# Patient Record
Sex: Female | Born: 1970 | Race: White | Hispanic: No | Smoking: Never smoker
Health system: Southern US, Community
[De-identification: ages and names within clinical notes are randomized; demographics above are authoritative.]

## PROBLEM LIST (undated history)

## (undated) DIAGNOSIS — Z114 Encounter for screening for human immunodeficiency virus [HIV]: Secondary | ICD-10-CM

## (undated) DIAGNOSIS — F909 Attention-deficit hyperactivity disorder, unspecified type: Secondary | ICD-10-CM

## (undated) DIAGNOSIS — T7840XA Allergy, unspecified, initial encounter: Secondary | ICD-10-CM

## (undated) DIAGNOSIS — N920 Excessive and frequent menstruation with regular cycle: Secondary | ICD-10-CM

## (undated) DIAGNOSIS — N390 Urinary tract infection, site not specified: Secondary | ICD-10-CM

## (undated) DIAGNOSIS — Z973 Presence of spectacles and contact lenses: Secondary | ICD-10-CM

## (undated) DIAGNOSIS — M1990 Unspecified osteoarthritis, unspecified site: Secondary | ICD-10-CM

## (undated) DIAGNOSIS — Z8619 Personal history of other infectious and parasitic diseases: Secondary | ICD-10-CM

## (undated) HISTORY — DX: Urinary tract infection, site not specified: N39.0

## (undated) HISTORY — DX: Encounter for screening for human immunodeficiency virus (HIV): Z11.4

## (undated) HISTORY — DX: Allergy, unspecified, initial encounter: T78.40XA

## (undated) HISTORY — DX: Personal history of other infectious and parasitic diseases: Z86.19

---

## 2000-01-11 ENCOUNTER — Encounter: Admission: RE | Admit: 2000-01-11 | Discharge: 2000-01-11 | Payer: Self-pay | Admitting: *Deleted

## 2000-01-11 ENCOUNTER — Encounter: Payer: Self-pay | Admitting: *Deleted

## 2000-08-19 ENCOUNTER — Encounter: Admission: RE | Admit: 2000-08-19 | Discharge: 2000-08-19 | Payer: Self-pay | Admitting: *Deleted

## 2000-08-19 ENCOUNTER — Encounter: Payer: Self-pay | Admitting: *Deleted

## 2001-01-09 ENCOUNTER — Other Ambulatory Visit: Admission: RE | Admit: 2001-01-09 | Discharge: 2001-01-09 | Payer: Self-pay | Admitting: *Deleted

## 2002-02-04 ENCOUNTER — Inpatient Hospital Stay (HOSPITAL_COMMUNITY): Admission: AD | Admit: 2002-02-04 | Discharge: 2002-02-08 | Payer: Self-pay | Admitting: Obstetrics and Gynecology

## 2002-02-05 ENCOUNTER — Encounter (INDEPENDENT_AMBULATORY_CARE_PROVIDER_SITE_OTHER): Payer: Self-pay

## 2002-02-15 ENCOUNTER — Encounter: Admission: RE | Admit: 2002-02-15 | Discharge: 2002-03-17 | Payer: Self-pay | Admitting: *Deleted

## 2002-03-12 ENCOUNTER — Other Ambulatory Visit: Admission: RE | Admit: 2002-03-12 | Discharge: 2002-03-12 | Payer: Self-pay | Admitting: Obstetrics and Gynecology

## 2003-04-11 ENCOUNTER — Other Ambulatory Visit: Admission: RE | Admit: 2003-04-11 | Discharge: 2003-04-11 | Payer: Self-pay | Admitting: Obstetrics and Gynecology

## 2006-02-08 HISTORY — PX: WISDOM TOOTH EXTRACTION: SHX21

## 2006-08-15 ENCOUNTER — Ambulatory Visit: Payer: Self-pay | Admitting: Family Medicine

## 2006-12-08 ENCOUNTER — Ambulatory Visit: Payer: Self-pay | Admitting: Family Medicine

## 2008-04-10 ENCOUNTER — Ambulatory Visit: Payer: Self-pay | Admitting: Family Medicine

## 2008-07-17 ENCOUNTER — Ambulatory Visit: Payer: Self-pay | Admitting: Family Medicine

## 2009-11-03 ENCOUNTER — Ambulatory Visit: Payer: Self-pay | Admitting: Family Medicine

## 2010-06-26 NOTE — Op Note (Signed)
NAME:  Raven Gomez, Raven Gomez                       ACCOUNT NO.:  [id number]   MEDICAL RECORD NO.:  [id number]                   PATIENT TYPE:  INP   LOCATION:  9133                                 FACILITY:  [facility]   PHYSICIAN:  Gerri Spore B. Earlene Plater, M.D.               DATE OF BIRTH:  02-08-1971   DATE OF PROCEDURE:  02/05/2002  DATE OF DISCHARGE:                                 OPERATIVE REPORT   PREOPERATIVE DIAGNOSES:  1. Term intrauterine pregnancy, spontaneous labor.  2. Nonreassuring fetal heart rate tracing.  3. Chorioamnionitis.  4. Meconium-stained amniotic fluid.   POSTOPERATIVE DIAGNOSES:  1. Term intrauterine pregnancy, spontaneous labor.  2. Nonreassuring fetal heart rate tracing.  3. Chorioamnionitis.  4. Meconium-stained amniotic fluid.   PROCEDURE:  Primary low transverse cesarean section.   SURGEON:  Chester Holstein. Earlene Plater, M.D.   ANESTHESIA:  Epidural.   FINDINGS:  Viable female infant, Apgars 8 and 10.  Cord pH 7.34.  Thick  meconium.  Weight 6 pounds 11 ounces.   ESTIMATED BLOOD LOSS:  800.   URINE OUTPUT:  350.   COMPLICATIONS:  None.   INDICATIONS:  The patient presented in spontaneous labor, progressed to 8  cm, and developed repetitive late decelerations and chorioamnionitis.  Was  subsequently delivered by primary cesarean section for these reasons.   DESCRIPTION OF PROCEDURE:  The patient was taken to the operating room with  epidural anesthesia in place.  She was prepped and draped in standard  fashion and a Foley catheter was already in the bladder.   A Pfannenstiel incision was made and carried to the underlying fascia  sharply.  The fascia was divided in the midline and extended laterally with  Mayo scissors.  Kocher clamps used to elevate the superior aspect of the  incision and the underlying rectus muscles were dissected off sharply,  repeated inferiorly in a similar fashion.   The midline of the rectus muscles was identified and the underlying  posterior superior peritoneum elevated and entered sharply with the  Metzenbaum scissors.  Extended inferiorly with a good visualization of  surrounding organs.   The bladder blade was inserted, the vesicouterine peritoneum identified,  elevated, and bladder flap created with sharp and blunt technique.   The uterine incision made in a low transverse fashion with a knife.  Thick  meconium noted at amniotomy.   The infant's head was delivered through the incision and the nose and mouth  suctioned with the DeLee and the remainder of the infant delivered without  difficulty.  The cord was clamped and cut and the infant handed off to the  awaiting pediatricians.  The patient was already on penicillin for group B  strep and was also on gentamicin given for her fever; therefore, no  additional antibiotics were given after cord clamp.   The placenta was removed by manual expression and the uterus exteriorized  and cleared of all clots and  debris.  The uterine incision was inspected,  and there was no extension.  The tubes, ovaries, and uterus were also normal  in appearance.   The uterine incision was closed in a running locked stitch of 0 Monocryl.  A  second imbricating layer placed with the same suture and hemostasis  obtained.   The uterus was reinserted in the abdomen and the pelvis irrigated.  The  bladder flap and subfascial spaces were hemostatic.  The fascia was closed  in a running stitch of 0 Vicryl.  The subcutaneous tissue was irrigated and  made hemostatic with the Bovie.  The skin was closed with staples.   The patient tolerated the procedure well, and there were no complications.  She was taken to the recovery room, where she was in stable condition.                                               Gerri Spore B. Earlene Plater, M.D.    WBD/MEDQ  D:  02/05/2002  T:  02/05/2002  Job:  [id number]

## 2010-06-26 NOTE — Discharge Summary (Signed)
   NAME:  Raven Gomez, Raven Gomez                       ACCOUNT NO.:  [id number]   MEDICAL RECORD NO.:  [id number]                   PATIENT TYPE:  INP   LOCATION:  9133                                 FACILITY:  [facility]   PHYSICIAN:  Gerri Spore B. Earlene Plater, M.D.               DATE OF BIRTH:  [birth year]   DATE OF ADMISSION:  02/04/2002  DATE OF DISCHARGE:  02/08/2002                                 DISCHARGE SUMMARY   ADMISSION DIAGNOSES:  1. Term intrauterine pregnancy.  2. Spontaneous labor.  3. Nonreassuring fetal heart rate tracing.  4. Chorioamnionitis.  5. Meconium stained amniotic fluid.   DISCHARGE DIAGNOSES:  1. Term intrauterine pregnancy.  2. Spontaneous labor.  3. Nonreassuring fetal heart rate tracing.  4. Chorioamnionitis.  5. Meconium stained amniotic fluid.   PROCEDURE:  1. Admission for management of labor.  2. Primary low transverse cesarean section for nonreassuring fetal heart     rate tracing.   HISTORY OF PRESENT ILLNESS:  The patient presented in spontaneous labor and  progressed to 8 cm.  Subsequently developed repetitive late decelerations  and chorioamnionitis.  Was therefore delivered by primary cesarean section.  Findings at the time of surgery included a viable female infant, Apgars 8 and  10, cord pH 7.34 with thick meconium, weight 6 pounds 11 ounces.   Postoperatively patient rapidly regained her ability to ambulate, void, and  tolerate a regular diet.  She was kept on intravenous antibiotics for 24  hours and remained afebrile.  Was known to have a superficial wound  cellulitis and was subsequently started on Keflex.  She was discharged home  on the third postoperative day in satisfactory condition.   FOLLOW UP:  In four days for incision check.   DISCHARGE INSTRUCTIONS:  Standard preprinted instructions were also given  prior to dismissal.   DISCHARGE MEDICATIONS:  1. Keflex 500 mg p.o. q.i.d. for 10 days.  2.     Tylox one to two q.4-6h. as needed for  pain.  3. Chromagen Forte one tablet b.i.d. for mild anemia.   DISPOSITION:  Satisfactory.                                               Gerri Spore B. Earlene Plater, M.D.    WBD/MEDQ  D:  03/15/2002  T:  03/15/2002  Job:  [id number]

## 2010-10-09 ENCOUNTER — Encounter: Payer: Self-pay | Admitting: Family Medicine

## 2011-04-02 ENCOUNTER — Other Ambulatory Visit: Payer: Self-pay | Admitting: Dermatology

## 2015-02-11 ENCOUNTER — Ambulatory Visit (INDEPENDENT_AMBULATORY_CARE_PROVIDER_SITE_OTHER): Payer: BLUE CROSS/BLUE SHIELD | Admitting: Family Medicine

## 2015-02-11 VITALS — BP 110/70 | HR 60 | Wt 131.2 lb

## 2015-02-11 DIAGNOSIS — D72819 Decreased white blood cell count, unspecified: Secondary | ICD-10-CM

## 2015-02-11 DIAGNOSIS — R5383 Other fatigue: Secondary | ICD-10-CM | POA: Diagnosis not present

## 2015-02-11 DIAGNOSIS — Z8619 Personal history of other infectious and parasitic diseases: Secondary | ICD-10-CM | POA: Diagnosis not present

## 2015-02-11 LAB — COMPREHENSIVE METABOLIC PANEL
ALK PHOS: 48 U/L (ref 33–115)
ALT: 10 U/L (ref 6–29)
AST: 14 U/L (ref 10–35)
Albumin: 4.2 g/dL (ref 3.6–5.1)
BUN: 10 mg/dL (ref 7–25)
CALCIUM: 8.8 mg/dL (ref 8.6–10.2)
CO2: 26 mmol/L (ref 20–31)
Chloride: 105 mmol/L (ref 98–110)
Creat: 0.81 mg/dL (ref 0.50–1.10)
Glucose, Bld: 88 mg/dL (ref 65–99)
POTASSIUM: 5 mmol/L (ref 3.5–5.3)
Sodium: 140 mmol/L (ref 135–146)
TOTAL PROTEIN: 6.6 g/dL (ref 6.1–8.1)
Total Bilirubin: 0.5 mg/dL (ref 0.2–1.2)

## 2015-02-11 LAB — CBC WITH DIFFERENTIAL/PLATELET
BASOS ABS: 0 10*3/uL (ref 0.0–0.1)
Basophils Relative: 0 % (ref 0–1)
EOS PCT: 1 % (ref 0–5)
Eosinophils Absolute: 0 10*3/uL (ref 0.0–0.7)
HEMATOCRIT: 39.6 % (ref 36.0–46.0)
Hemoglobin: 14.1 g/dL (ref 12.0–15.0)
LYMPHS ABS: 1.4 10*3/uL (ref 0.7–4.0)
Lymphocytes Relative: 36 % (ref 12–46)
MCH: 33.7 pg (ref 26.0–34.0)
MCHC: 35.6 g/dL (ref 30.0–36.0)
MCV: 94.7 fL (ref 78.0–100.0)
MPV: 9.4 fL (ref 8.6–12.4)
Monocytes Absolute: 0.5 10*3/uL (ref 0.1–1.0)
Monocytes Relative: 14 % — ABNORMAL HIGH (ref 3–12)
NEUTROS PCT: 49 % (ref 43–77)
Neutro Abs: 1.9 10*3/uL (ref 1.7–7.7)
Platelets: 241 10*3/uL (ref 150–400)
RBC: 4.18 MIL/uL (ref 3.87–5.11)
RDW: 13.1 % (ref 11.5–15.5)
WBC: 3.8 10*3/uL — ABNORMAL LOW (ref 4.0–10.5)

## 2015-02-11 LAB — POCT MONO (EPSTEIN BARR VIRUS): Mono, POC: NEGATIVE

## 2015-02-11 LAB — TSH: TSH: 1.062 u[IU]/mL (ref 0.350–4.500)

## 2015-02-11 NOTE — Patient Instructions (Signed)
Fatigue  Fatigue is feeling tired all of the time, a lack of energy, or a lack of motivation. Occasional or mild fatigue is often a normal response to activity or life in general. However, long-lasting (chronic) or extreme fatigue may indicate an underlying medical condition.  HOME CARE INSTRUCTIONS   Watch your fatigue for any changes. The following actions may help to lessen any discomfort you are feeling:  · Talk to your health care provider about how much sleep you need each night. Try to get the required amount every night.  · Take medicines only as directed by your health care provider.  · Eat a healthy and nutritious diet. Ask your health care provider if you need help changing your diet.  · Drink enough fluid to keep your urine clear or pale yellow.  · Practice ways of relaxing, such as yoga, meditation, massage therapy, or acupuncture.  · Exercise regularly.    · Change situations that cause you stress. Try to keep your work and personal routine reasonable.  · Do not abuse illegal drugs.  · Limit alcohol intake to no more than 1 drink per day for nonpregnant women and 2 drinks per day for men. One drink equals 12 ounces of beer, 5 ounces of wine, or 1½ ounces of hard liquor.  · Take a multivitamin, if directed by your health care provider.  SEEK MEDICAL CARE IF:   · Your fatigue does not get better.  · You have a fever.    · You have unintentional weight loss or gain.  · You have headaches.    · You have difficulty:      Falling asleep.    Sleeping throughout the night.  · You feel angry, guilty, anxious, or sad.     · You are unable to have a bowel movement (constipation).    · You skin is dry.     · Your legs or another part of your body is swollen.    SEEK IMMEDIATE MEDICAL CARE IF:   · You feel confused.    · Your vision is blurry.  · You feel faint or pass out.    · You have a severe headache.    · You have severe abdominal, pelvic, or back pain.    · You have chest pain, shortness of breath, or an  irregular or fast heartbeat.    · You are unable to urinate or you urinate less than normal.    · You develop abnormal bleeding, such as bleeding from the rectum, vagina, nose, lungs, or nipples.  · You vomit blood.     · You have thoughts about harming yourself or committing suicide.    · You are worried that you might harm someone else.       This information is not intended to replace advice given to you by your health care provider. Make sure you discuss any questions you have with your health care provider.     Document Released: 11/22/2006 Document Revised: 02/15/2014 Document Reviewed: 05/29/2013  Elsevier Interactive Patient Education ©2016 Elsevier Inc.

## 2015-02-11 NOTE — Progress Notes (Signed)
   Subjective:    Patient ID: Raven Gomez, female    DOB: 04-27-1970, 45 y.o.   MRN: [id number]  HPI Chief Complaint  Patient presents with  . new pt    new pt diagnosed with shingles on dec 12th under armpit. and can go for like 3 hours then exhausted. would like a b12 shot. and has a cold on top of that.    She is new to the practice and here for an acute complaint. She complains of feeling tired and wanting to sleep "all the time" for past 2-3 weeks. She states a friend of hers told her she should just get a B12 shot.  She was seen and treated for shingles on December 12th. States rash is mostly gone as well as pain. She also reports having gotten over a head cold over the past week.  Reports history of low white blood cell count.  Denies other significant medical history. She does admit to ADD and depression and is currently taking medication for this.  Sees Dr. Benjie Karvonen at [facility] and has labs done annually at that practice.  Denies fever, chills, nausea, vomiting, diarrhea, unexplained weight loss. Also denies cough, night sweats, chest pain, palpitations, DOE, arthralgias, LE swelling or GU symptoms.   States labs last year were normal.   She is seeing Durene Fruits at [facility] for ADD and cymbalta.  She denies feeling depressed.  Diet is unchanged, states she does not eat breakfast. She does not exercise.  Works as Emergency planning/management officer.    Discussed medications, allergies, past medical and social history.   Review of Systems Pertinent positives and negatives in the history of present illness.    Objective:   Physical Exam BP 110/70 mmHg  Pulse 60  Wt 131 lb 3.2 oz (59.512 kg) Alert and in no distress. No sinus tenderness. Tympanic membranes and canals are normal. Pharyngeal area is normal. Neck is supple without adenopathy or thyromegaly. Cardiac exam shows a regular sinus rhythm without murmurs or gallops. Lungs are clear to auscultation. Abdomen soft, non distended, normal  bowel sounds, mild tenderness to LUQ, without guarding, rebound, or referred pain, no hepatosplenomegaly. No CVAT. Extremity exam shows normal pulses, no edema.   Mono spot negative    Assessment & Plan:  Other fatigue - Plan: POCT Mono (Epstein Barr Virus), TSH, CBC with Differential/Platelet, Comprehensive metabolic panel  History of shingles  Decreased white blood cell count - Plan: CBC with Differential/Platelet  Discussed that based on history and exam there is no clear cut explanation for her symptoms. Her mono spot was negative. Also discussed the numerous differentials for fatigue and that we will get labs to look for an underlying condition that could be causing this. Discussed that she appears to have a viral syndrome that is making her tired but that there could be other etiologies involved. Recommend not skipping meals and eating breakfast and staying well hydrated. Also suggest trying to sleep less during the day and be more physically active.

## 2015-02-12 LAB — VITAMIN D 25 HYDROXY (VIT D DEFICIENCY, FRACTURES): VIT D 25 HYDROXY: 36 ng/mL (ref 30–100)

## 2015-02-27 ENCOUNTER — Encounter: Payer: Self-pay | Admitting: Family Medicine

## 2015-06-27 ENCOUNTER — Ambulatory Visit: Payer: BLUE CROSS/BLUE SHIELD | Admitting: Family Medicine

## 2015-08-27 ENCOUNTER — Telehealth: Payer: Self-pay | Admitting: Family Medicine

## 2015-08-27 NOTE — Telephone Encounter (Signed)
Pt called and requested a copy of her labs printed off and she is going to come pick them up

## 2016-02-04 ENCOUNTER — Encounter: Payer: Self-pay | Admitting: Family Medicine

## 2016-02-04 ENCOUNTER — Ambulatory Visit (INDEPENDENT_AMBULATORY_CARE_PROVIDER_SITE_OTHER): Payer: BLUE CROSS/BLUE SHIELD | Admitting: Family Medicine

## 2016-02-04 VITALS — BP 110/64 | HR 82 | Temp 98.1°F | Resp 16 | Wt 132.6 lb

## 2016-02-04 DIAGNOSIS — D72819 Decreased white blood cell count, unspecified: Secondary | ICD-10-CM | POA: Diagnosis not present

## 2016-02-04 DIAGNOSIS — K120 Recurrent oral aphthae: Secondary | ICD-10-CM

## 2016-02-04 DIAGNOSIS — R5383 Other fatigue: Secondary | ICD-10-CM

## 2016-02-04 LAB — CBC WITH DIFFERENTIAL/PLATELET
BASOS ABS: 0 {cells}/uL (ref 0–200)
Basophils Relative: 0 %
EOS ABS: 47 {cells}/uL (ref 15–500)
EOS PCT: 1 %
HCT: 39.3 % (ref 35.0–45.0)
Hemoglobin: 13.1 g/dL (ref 11.7–15.5)
LYMPHS PCT: 34 %
Lymphs Abs: 1598 cells/uL (ref 850–3900)
MCH: 33.2 pg — AB (ref 27.0–33.0)
MCHC: 33.3 g/dL (ref 32.0–36.0)
MCV: 99.5 fL (ref 80.0–100.0)
MONOS PCT: 10 %
MPV: 9.9 fL (ref 7.5–12.5)
Monocytes Absolute: 470 cells/uL (ref 200–950)
NEUTROS PCT: 55 %
Neutro Abs: 2585 cells/uL (ref 1500–7800)
PLATELETS: 221 10*3/uL (ref 140–400)
RBC: 3.95 MIL/uL (ref 3.80–5.10)
RDW: 12.7 % (ref 11.0–15.0)
WBC: 4.7 10*3/uL (ref 4.0–10.5)

## 2016-02-04 LAB — COMPREHENSIVE METABOLIC PANEL
ALT: 10 U/L (ref 6–29)
AST: 15 U/L (ref 10–35)
Albumin: 4.1 g/dL (ref 3.6–5.1)
Alkaline Phosphatase: 52 U/L (ref 33–115)
BUN: 10 mg/dL (ref 7–25)
CHLORIDE: 104 mmol/L (ref 98–110)
CO2: 24 mmol/L (ref 20–31)
Calcium: 8.9 mg/dL (ref 8.6–10.2)
Creat: 0.67 mg/dL (ref 0.50–1.10)
Glucose, Bld: 92 mg/dL (ref 65–99)
POTASSIUM: 4.2 mmol/L (ref 3.5–5.3)
SODIUM: 137 mmol/L (ref 135–146)
TOTAL PROTEIN: 6.6 g/dL (ref 6.1–8.1)
Total Bilirubin: 0.6 mg/dL (ref 0.2–1.2)

## 2016-02-04 LAB — VITAMIN B12: VITAMIN B 12: 306 pg/mL (ref 200–1100)

## 2016-02-04 LAB — FOLATE: FOLATE: 14.1 ng/mL (ref >5.4)

## 2016-02-04 LAB — TSH: TSH: 1.09 m[IU]/L

## 2016-02-04 MED ORDER — TRIAMCINOLONE ACETONIDE 0.1 % MT PSTE: 1.0000 "application " | PASTE | Freq: Two times a day (BID) | OROMUCOSAL | 12 refills | Status: DC

## 2016-02-04 NOTE — Patient Instructions (Signed)
Canker Sores Introduction Canker sores are small, painful sores that develop inside your mouth. They may also be called aphthous ulcers. You can get canker sores on the inside of your lips or cheeks, on your tongue, or anywhere inside your mouth. You can have just one canker sore or several of them. Canker sores cannot be passed from one person to another (noncontagious). These sores are different than the sores that you may get on the outside of your lips (cold sores or fever blisters). Canker sores usually start as painful red bumps. Then they turn into small white, yellow, or gray ulcers that have red borders. The ulcers may be quite painful. The pain may be worse when you eat or drink. What are the causes? The cause of this condition is not known. What increases the risk? This condition is more likely to develop in:  Women.  People in their teens or 43s.  Women who are having their menstrual period.  People who are under a lot of emotional stress.  People who do not get enough iron or B vitamins.  People who have poor oral hygiene.  People who have an injury inside the mouth. This can happen after having dental work or from chewing something hard. What are the signs or symptoms? Along with the canker sore, symptoms may also include:  Fever.  Fatigue.  Swollen lymph nodes in your neck. How is this diagnosed? This condition can be diagnosed based on your symptoms. Your health care provider will also examine your mouth. Your health care provider may also do tests if you get canker sores often or if they are very bad. Tests may include:  Blood tests to rule out other causes of canker sores.  Taking swabs from the sore to check for infection.  Taking a small piece of skin from the sore (biopsy) to test it for cancer. How is this treated? Most canker sores clear up without treatment in about 10 days. Home care is usually the only treatment that you will need. Over-the-counter  medicines can relieve discomfort.If you have severe canker sores, your health care provider may prescribe:  Numbing ointment to relieve pain.  Vitamins.  Steroid medicines. These may be given as:  Oral pills.  Mouth rinses.  Gels.  Antibiotic mouth rinse. Follow these instructions at home:  Apply, take, or use medicines only as directed by your health care provider. These include vitamins.  If you were prescribed an antibiotic mouth rinse, finish all of it even if you start to feel better.  Until the sores are healed:  Do not drink coffee or citrus juices.  Do not eat spicy or salty foods.  Use a mild, over-the-counter mouth rinse as directed by your health care provider.  Practice good oral hygiene.  Floss your teeth every day.  Brush your teeth with a soft brush twice each day. Contact a health care provider if:  Your symptoms do not get better after two weeks.  You also have a fever or swollen glands.  You get canker sores often.  You have a canker sore that is getting larger.  You cannot eat or drink due to your canker sores. This information is not intended to replace advice given to you by your health care provider. Make sure you discuss any questions you have with your health care provider. Document Released: 05/22/2010 Document Revised: 07/03/2015 Document Reviewed: 12/26/2013  2017 Elsevier

## 2016-02-04 NOTE — Progress Notes (Signed)
   Subjective:    Patient ID: Raven Gomez, female    DOB: 08-04-1970, 45 y.o.   MRN: [id number]  HPI Chief Complaint  Patient presents with  . mouth uclers    4 mouth uclers- feeling - achy, stomach is broken out, occasionally a stingy feeling where the shingles was last year   She is here with complaints of ulcers in her mouth for the past 2 weeks. States she feels bad overall, tired and achy. Has been using OTC medication kanka and occasional salt water for sores. States she has not noticed any improvement in 2 weeks.  States she has been caring for her son and is under a great deal of stress.  Denies history of autoimmune disease or diabetes.  Reports history of oral ulcers but states they have healed in a day or two in the past.  Denies ocular involvement, skin lesions, genital sores, joint pains.   Denies fever, chills, headache, dizziness, rhinorrhea, nasal congestion, post nasal drainage, sore throat, cough, chest pain, palpitations, abdominal pain, N/V/D.   States she had blood work done in the fall at her OB/GYN. She does not know which labs other than she had her white blood count checked due to it being low. States it was still low in October.    No new medications. No new foods.  Does not smoke. Drinks wine 3-5 glasses per week.   She sees her dentist twice annually and last dental exam was 5 months ago.   Reviewed allergies, medications, past medical, surgical,  and social history.   Review of Systems Pertinent positives and negatives in the history of present illness.     Objective:   Physical Exam BP 110/64   Pulse 82   Temp 98.1 F (36.7 C) (Oral)   Resp 16   Wt 132 lb 9.6 oz (60.1 kg)   SpO2 99%   Alert and in no distress. Skin warm and dry. Tympanic membranes and canals are normal. Pharyngeal area is normal. Neck is supple without adenopathy or thyromegaly. Cardiac exam shows a regular sinus rhythm without murmurs or gallops. Lungs are clear to  auscultation.  5 mm oval ulcer with erythematous edges to lower buccal mucosa. 3 mm round-oval ulcer to upper anterior buccal mucosa. Two to three smaller aphthous ulcers throughout buccal mucosa. No soft palate or tongue lesions.       Assessment & Plan:  Recurrent aphthous ulcer - Plan: CBC with Differential/Platelet, Comprehensive metabolic panel, TSH, Vitamin B12, HIV antibody, RPR, Folate, Sedimentation rate  Leukopenia, unspecified type - Plan: CBC with Differential/Platelet  Fatigue, unspecified type - Plan: CBC with Differential/Platelet, Comprehensive metabolic panel, TSH, VITAMIN D 25 Hydroxy (Vit-D Deficiency, Fractures), Vitamin B12, HIV antibody, RPR, Folate, Sedimentation rate  Discussed that there is no obvious explanation for the oral ulcers. She appears to be immunocompetent. Plan to check labs to look for underlying etiology for fatigue and ulcers. Kenalog in orabase prescribed.  History of leukopenia and will recheck this.  Follow up pending labs and in 2 weeks to ensure ulcers are healing.

## 2016-02-05 LAB — HIV ANTIBODY (ROUTINE TESTING W REFLEX): HIV 1&2 Ab, 4th Generation: NONREACTIVE

## 2016-02-05 LAB — SEDIMENTATION RATE: SED RATE: 4 mm/h (ref 0–20)

## 2016-02-05 LAB — VITAMIN D 25 HYDROXY (VIT D DEFICIENCY, FRACTURES): VIT D 25 HYDROXY: 38 ng/mL (ref 30–100)

## 2016-02-05 LAB — RPR

## 2016-06-10 DIAGNOSIS — L255 Unspecified contact dermatitis due to plants, except food: Secondary | ICD-10-CM | POA: Diagnosis not present

## 2016-06-14 DIAGNOSIS — Z01419 Encounter for gynecological examination (general) (routine) without abnormal findings: Secondary | ICD-10-CM | POA: Diagnosis not present

## 2016-06-14 DIAGNOSIS — Z6826 Body mass index (BMI) 26.0-26.9, adult: Secondary | ICD-10-CM | POA: Diagnosis not present

## 2016-06-23 DIAGNOSIS — Z79899 Other long term (current) drug therapy: Secondary | ICD-10-CM | POA: Diagnosis not present

## 2016-06-23 DIAGNOSIS — F411 Generalized anxiety disorder: Secondary | ICD-10-CM | POA: Diagnosis not present

## 2016-06-23 DIAGNOSIS — F902 Attention-deficit hyperactivity disorder, combined type: Secondary | ICD-10-CM | POA: Diagnosis not present

## 2016-10-04 DIAGNOSIS — F902 Attention-deficit hyperactivity disorder, combined type: Secondary | ICD-10-CM | POA: Diagnosis not present

## 2016-10-04 DIAGNOSIS — Z79899 Other long term (current) drug therapy: Secondary | ICD-10-CM | POA: Diagnosis not present

## 2016-11-05 DIAGNOSIS — M5020 Other cervical disc displacement, unspecified cervical region: Secondary | ICD-10-CM | POA: Diagnosis not present

## 2016-11-05 DIAGNOSIS — M542 Cervicalgia: Secondary | ICD-10-CM | POA: Diagnosis not present

## 2016-11-05 DIAGNOSIS — M9902 Segmental and somatic dysfunction of thoracic region: Secondary | ICD-10-CM | POA: Diagnosis not present

## 2016-11-05 DIAGNOSIS — M9901 Segmental and somatic dysfunction of cervical region: Secondary | ICD-10-CM | POA: Diagnosis not present

## 2016-11-08 DIAGNOSIS — M9902 Segmental and somatic dysfunction of thoracic region: Secondary | ICD-10-CM | POA: Diagnosis not present

## 2016-11-08 DIAGNOSIS — M9901 Segmental and somatic dysfunction of cervical region: Secondary | ICD-10-CM | POA: Diagnosis not present

## 2016-11-08 DIAGNOSIS — M5020 Other cervical disc displacement, unspecified cervical region: Secondary | ICD-10-CM | POA: Diagnosis not present

## 2016-11-08 DIAGNOSIS — M542 Cervicalgia: Secondary | ICD-10-CM | POA: Diagnosis not present

## 2016-12-21 DIAGNOSIS — M542 Cervicalgia: Secondary | ICD-10-CM | POA: Diagnosis not present

## 2017-01-06 DIAGNOSIS — F411 Generalized anxiety disorder: Secondary | ICD-10-CM | POA: Diagnosis not present

## 2017-01-06 DIAGNOSIS — F902 Attention-deficit hyperactivity disorder, combined type: Secondary | ICD-10-CM | POA: Diagnosis not present

## 2017-01-06 DIAGNOSIS — Z79899 Other long term (current) drug therapy: Secondary | ICD-10-CM | POA: Diagnosis not present

## 2017-04-04 DIAGNOSIS — F902 Attention-deficit hyperactivity disorder, combined type: Secondary | ICD-10-CM | POA: Diagnosis not present

## 2017-04-04 DIAGNOSIS — Z79899 Other long term (current) drug therapy: Secondary | ICD-10-CM | POA: Diagnosis not present

## 2017-08-27 ENCOUNTER — Inpatient Hospital Stay (HOSPITAL_BASED_OUTPATIENT_CLINIC_OR_DEPARTMENT_OTHER)
Admission: EM | Admit: 2017-08-27 | Discharge: 2017-08-29 | DRG: 419 | Disposition: A | Discharge status: Home / Self Care | Payer: 59 | Attending: Surgery | Admitting: Surgery

## 2017-08-27 ENCOUNTER — Other Ambulatory Visit: Payer: Self-pay

## 2017-08-27 ENCOUNTER — Encounter (HOSPITAL_BASED_OUTPATIENT_CLINIC_OR_DEPARTMENT_OTHER): Payer: Self-pay | Admitting: Emergency Medicine

## 2017-08-27 ENCOUNTER — Emergency Department (HOSPITAL_BASED_OUTPATIENT_CLINIC_OR_DEPARTMENT_OTHER): Payer: 59

## 2017-08-27 DIAGNOSIS — K8010 Calculus of gallbladder with chronic cholecystitis without obstruction: Principal | ICD-10-CM | POA: Diagnosis present

## 2017-08-27 DIAGNOSIS — R1011 Right upper quadrant pain: Secondary | ICD-10-CM | POA: Diagnosis present

## 2017-08-27 DIAGNOSIS — K819 Cholecystitis, unspecified: Secondary | ICD-10-CM | POA: Diagnosis present

## 2017-08-27 DIAGNOSIS — K8000 Calculus of gallbladder with acute cholecystitis without obstruction: Secondary | ICD-10-CM

## 2017-08-27 LAB — CBC WITH DIFFERENTIAL/PLATELET
BASOS ABS: 0 10*3/uL (ref 0.0–0.1)
Basophils Relative: 0 %
EOS PCT: 2 %
Eosinophils Absolute: 0.1 10*3/uL (ref 0.0–0.7)
HCT: 40.9 % (ref 36.0–46.0)
Hemoglobin: 14.5 g/dL (ref 12.0–15.0)
LYMPHS PCT: 32 %
Lymphs Abs: 1.2 10*3/uL (ref 0.7–4.0)
MCH: 34.3 pg — AB (ref 26.0–34.0)
MCHC: 35.5 g/dL (ref 30.0–36.0)
MCV: 96.7 fL (ref 78.0–100.0)
Monocytes Absolute: 0.6 10*3/uL (ref 0.1–1.0)
Monocytes Relative: 15 %
Neutro Abs: 2 10*3/uL (ref 1.7–7.7)
Neutrophils Relative %: 51 %
PLATELETS: 215 10*3/uL (ref 150–400)
RBC: 4.23 MIL/uL (ref 3.87–5.11)
RDW: 12.5 % (ref 11.5–15.5)
WBC: 3.8 10*3/uL — AB (ref 4.0–10.5)

## 2017-08-27 LAB — COMPREHENSIVE METABOLIC PANEL
ALT: 75 U/L — ABNORMAL HIGH (ref 0–44)
AST: 67 U/L — AB (ref 15–41)
Albumin: 4.2 g/dL (ref 3.5–5.0)
Alkaline Phosphatase: 79 U/L (ref 38–126)
Anion gap: 9 (ref 5–15)
BUN: 8 mg/dL (ref 6–20)
CALCIUM: 8.8 mg/dL — AB (ref 8.9–10.3)
CHLORIDE: 105 mmol/L (ref 98–111)
CO2: 26 mmol/L (ref 22–32)
Creatinine, Ser: 0.71 mg/dL (ref 0.44–1.00)
Glucose, Bld: 99 mg/dL (ref 70–99)
POTASSIUM: 4.3 mmol/L (ref 3.5–5.1)
Sodium: 140 mmol/L (ref 135–145)
Total Bilirubin: 0.6 mg/dL (ref 0.3–1.2)
Total Protein: 7 g/dL (ref 6.5–8.1)

## 2017-08-27 LAB — LIPASE, BLOOD: Lipase: 29 U/L (ref 11–51)

## 2017-08-27 MED ORDER — ONDANSETRON HCL 4 MG/2ML IJ SOLN
4.0000 mg | Freq: Once | INTRAMUSCULAR | Status: AC
  Administered 2017-08-27: 4 mg via INTRAVENOUS
  Filled 2017-08-27: qty 2

## 2017-08-27 MED ORDER — SODIUM CHLORIDE 0.9 % IV SOLN
2.0000 g | INTRAVENOUS | Status: DC
  Administered 2017-08-27 – 2017-08-28 (×2): 2 g via INTRAVENOUS
  Filled 2017-08-27 (×2): qty 2

## 2017-08-27 MED ORDER — SODIUM CHLORIDE 0.9 % IV SOLN
INTRAVENOUS | Status: DC
  Administered 2017-08-27: 19:00:00 via INTRAVENOUS

## 2017-08-27 MED ORDER — SODIUM CHLORIDE 0.9 % IV BOLUS
1000.0000 mL | Freq: Once | INTRAVENOUS | Status: AC
  Administered 2017-08-27: 1000 mL via INTRAVENOUS

## 2017-08-27 MED ORDER — MORPHINE SULFATE (PF) 2 MG/ML IV SOLN
1.0000 mg | INTRAVENOUS | Status: DC | PRN
  Administered 2017-08-28 (×2): 2 mg via INTRAVENOUS
  Filled 2017-08-27 (×2): qty 1

## 2017-08-27 MED ORDER — HYDROCODONE-ACETAMINOPHEN 5-325 MG PO TABS
1.0000 | ORAL_TABLET | ORAL | Status: DC | PRN
  Administered 2017-08-28 (×2): 2 via ORAL
  Filled 2017-08-27 (×2): qty 2

## 2017-08-27 MED ORDER — ACETAMINOPHEN 325 MG PO TABS
650.0000 mg | ORAL_TABLET | Freq: Four times a day (QID) | ORAL | Status: AC | PRN
  Administered 2017-08-27: 650 mg via ORAL
  Filled 2017-08-27: qty 2

## 2017-08-27 MED ORDER — KCL IN DEXTROSE-NACL 20-5-0.45 MEQ/L-%-% IV SOLN
INTRAVENOUS | Status: DC
  Administered 2017-08-27: 23:00:00 via INTRAVENOUS
  Filled 2017-08-27 (×2): qty 1000

## 2017-08-27 MED ORDER — MORPHINE SULFATE (PF) 4 MG/ML IV SOLN
4.0000 mg | Freq: Once | INTRAVENOUS | Status: AC
  Administered 2017-08-27: 4 mg via INTRAVENOUS
  Filled 2017-08-27: qty 1

## 2017-08-27 MED ORDER — MORPHINE SULFATE (PF) 4 MG/ML IV SOLN
4.0000 mg | Freq: Once | INTRAVENOUS | Status: DC
  Filled 2017-08-27: qty 1

## 2017-08-27 NOTE — ED Triage Notes (Signed)
RUQ pain x 2 weeks with nausea. Sent from [facility] for eval.

## 2017-08-27 NOTE — H&P (Signed)
Re:   Raven Gomez DOB:   1970-03-12 MRN:   [id number]  Chief Complaint Abdominal pain  ASSESEMENT AND PLAN: 1.  Gall bladder disease, cholelithiasis  I discussed with the patient the indications and risks of gall bladder surgery.  The primary risks of gall bladder surgery include, but are not limited to, bleeding, infection, common bile duct injury, and open surgery.  There is also the risk that the patient may have continued symptoms after surgery.  We discussed the typical post-operative recovery course. I tried to answer the patient's questions.  Will admit and schedule surgery tomorrow.  Chief Complaint  Patient presents with  . Abdominal Pain   PHYSICIAN REQUESTING CONSULTATION:  Raven Gomez, [facility]  HISTORY OF PRESENT ILLNESS: Raven Gomez is a 47 y.o. (DOB: [birth year])  white female whose primary care physician is Raven Gomez.   Her son, Raven Gomez (69 yo), and ex husband, Raven Gomez, are at the bedside.   The patient has had for about 2-1/2 weeks worsening epigastric pain. She's had some nausea with this also. She has no prior history of peptic ulcer disease, liver disease, or colon disease. She's had no prior GI evaluation.  Her only prior abdominal surgery was a C-section. Last night she got nauseated enough that she went to [facility] this morning.Their are evaluation revealed probable gallbladder disease.  Raven Gomez talked to me on the phone and she was transferred to [facility].  [place] of abdomen - 08/27/2017 - 1. Cholelithiasis without other ultrasound evidence of cholecystitis or biliary obstruction. 2. Probable benign left hepatic cyst 1.5 cm.   Past Medical History:  Diagnosis Date  . 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 Contraception management 11/1986      Past Surgical History:  Procedure Laterality Date  . CESAREAN SECTION    . WISDOM TOOTH EXTRACTION  2008      Current Facility-Administered Medications    Medication Dose Route Frequency Provider Last Rate Last Dose  . 0.9 %  sodium chloride infusion   Intravenous Continuous Gomez, Whitney, MD      . morphine 4 MG/ML injection 4 mg  4 mg Intravenous Once Gomez, Whitney, MD      . morphine 4 MG/ML injection 4 mg  4 mg Intravenous Once Gomez, Sophia, PA-C       Current Outpatient Medications  Medication Sig Dispense Refill  . amphetamine-dextroamphetamine (ADDERALL XR) 10 MG 24 hr capsule Take 10 mg by mouth daily.    . DULoxetine (CYMBALTA) 60 MG capsule Take 60 mg by mouth daily.    . fluticasone (FLONASE) 50 MCG/ACT nasal spray Place into both nostrils daily.    Marland Kitchen MICROGESTIN FE 1/20 1-20 MG-MCG tablet TK 1 T PO QD. SKIP PLACEBO PILLS AND TK ACTIVE PILLS CONTINUOUSLY  4  . triamcinolone (KENALOG) 0.1 % paste Use as directed 1 application in the mouth or throat 2 (two) times daily. 5 g 12     No Known Allergies  REVIEW OF SYSTEMS: Skin:  No history of rash.  No history of abnormal moles. Infection:  No history of hepatitis or HIV.  No history of MRSA. Neurologic:  No history of stroke.  No history of seizure.  No history of headaches. Cardiac:  No history of hypertension. No history of heart disease.  No history of seeing a cardiologist. Pulmonary:  Does not smoke cigarettes.  No asthma or bronchitis.  No OSA/CPAP.  Endocrine:  No diabetes. No thyroid disease. Gastrointestinal:  See HPI Urologic:  No history of kidney stones.  No history of bladder infections. Musculoskeletal:  No history of joint or back disease. Hematologic:  No bleeding disorder.  No history of anemia.  Not anticoagulated. Psycho-social:  The patient is oriented.   The patient has no obvious psychologic or social impairment to understanding our conversation and plan.  SOCIAL and FAMILY HISTORY: Divorced. Her son, Raven Gomez, lives with her. Her son, Raven Gomez (42 yo), and ex husband, Raven Gomez, are at the bedside. She works as a Probation officer.  No family history  of gall bladder disease.Marland Kitchen  PHYSICAL EXAM: BP 128/76 (BP Location: Right Arm)   Pulse 82   Temp 98 F (36.7 C) (Oral)   Resp 17   Ht 4\' 11"  (1.499 m)   Wt 58.5 kg (129 lb)   LMP 08/08/2017   SpO2 99%   BMI 26.05 kg/m   General: WN WF who is alert and generally healthy appearing.  Skin:  Inspection and palpation - no mass or rash. Eyes:  Conjunctiva and lids unremarkable.            Pupils are equal Ears, Nose, Mouth, and Throat:  Ears and nose unremarkable            Lips and teeth are unremarable. Neck: Supple. No mass, trachea midline.  No thyroid mass.  Lymph Nodes:  No supraclavicular, cervical, or inguinal nodes. Lungs: Normal respiratory effort.  Clear to auscultation and symmetric breath sounds. Heart:  Palpation of the heart is normal.            Auscultation: RRR. No murmur or rub.  Abdomen: Soft. No mass. No hernia.  Normal bowel sounds.     Sore epigastrium to RUQ.  No rebound or peritoneal signs. Rectal: Not done. Musculoskeletal:  Good muscle strength and ROM  in upper and lower extremities.  Neurologic:  Grossly intact to motor and sensory function. Psychiatric: Normal judgement and insight. Behavior is normal.            Oriented to time, person, place.   DATA REVIEWED, COUNSELING AND COORDINATION OF CARE: Epic notes reviewed. Counseling and coordination of care exceeded more than 50% of the time spent with patient. Total time spent with patient and charting: 45 minutes  Raven Overall, MD,  [facility], [address]  [place], [place]    [place] Phone:  [phone] FAX:  [phone]

## 2017-08-27 NOTE — ED Notes (Signed)
Report given to Charge RN at [facility]- pt to be transferred to [facility] by POV. Pt instructed to remain NPO and head directly to [facility]. Pt voiced no concerns. Steady gate out of department appears to be in NAD.

## 2017-08-27 NOTE — ED Provider Notes (Signed)
[facility] EMERGENCY DEPARTMENT Provider Note   CSN: [id number] Arrival date & time: 08/27/17  1208     History   Chief Complaint Chief Complaint  Patient presents with  . Abdominal Pain    HPI Raven Gomez is a 47 y.o. female.  The history is provided by the patient.  Abdominal Pain   This is a new problem. Episode onset: 2 weeks. Episode frequency: started out intermittently but since yesterday it has been constant. The problem has been gradually worsening. The pain is associated with an unknown factor. The pain is located in the epigastric region and RUQ. The quality of the pain is cramping, shooting, throbbing and aching. The pain is at a severity of 7/10. The pain is moderate. Associated symptoms include anorexia and nausea. Pertinent negatives include fever, diarrhea, melena, vomiting, dysuria and frequency. Associated symptoms comments: Pain radiates to the back under the right shoulder blade. The symptoms are aggravated by activity and eating. Nothing relieves the symptoms. Past workup comments: seen at urgent care and sent here for imaging.    Past Medical History:  Diagnosis Date  . 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 Contraception management 11/1986    There are no active problems to display for this patient.   Past Surgical History:  Procedure Laterality Date  . CESAREAN SECTION    . WISDOM TOOTH EXTRACTION  2008     OB History   None      Home Medications    Prior to Admission medications   Medication Sig Start Date End Date Taking? Authorizing Provider  amphetamine-dextroamphetamine (ADDERALL XR) 10 MG 24 hr capsule Take 10 mg by mouth daily.    [provider]  DULoxetine (CYMBALTA) 60 MG capsule Take 60 mg by mouth daily.    [provider]  fluticasone (FLONASE) 50 MCG/ACT nasal spray Place into both nostrils daily.    [provider]  MICROGESTIN FE 1/20 1-20 MG-MCG tablet TK 1 T PO QD. SKIP  PLACEBO PILLS AND TK ACTIVE PILLS CONTINUOUSLY 01/11/15   [provider]  triamcinolone (KENALOG) 0.1 % paste Use as directed 1 application in the mouth or throat 2 (two) times daily. 02/04/16   Girtha Rm, NP-C    Family History No family history on file.  Social History Social History   Tobacco Use  . Smoking status: Never Smoker  . Smokeless tobacco: Never Used  Substance Use Topics  . Alcohol use: Yes    Comment: 3-5 glasses of wine per week  . Drug use: No     Allergies   Patient has no known allergies.   Review of Systems Review of Systems  Constitutional: Negative for fever.  Respiratory:       Mild congestion last week with cough but no fever or productive cough.  No SOB.  Gastrointestinal: Positive for abdominal pain, anorexia and nausea. Negative for diarrhea, melena and vomiting.  Genitourinary: Negative for dysuria and frequency.  All other systems reviewed and are negative.    Physical Exam Updated Vital Signs BP 136/79 (BP Location: Right Arm)   Pulse 80   Temp 98 F (36.7 C) (Oral)   Resp 18   Ht 4\' 11"  (1.499 m)   Wt 58.5 kg (129 lb)   LMP 08/08/2017   SpO2 100%   BMI 26.05 kg/m   Physical Exam  Constitutional: She is oriented to person, place, and time. She appears well-developed and well-nourished. No distress.  HENT:  Head: Normocephalic and atraumatic.  Mouth/Throat: Oropharynx is clear and moist.  Eyes: Pupils are equal, round, and reactive to light. Conjunctivae and EOM are normal.  Neck: Normal range of motion. Neck supple.  Cardiovascular: Normal rate, regular rhythm and intact distal pulses.  No murmur heard. Pulmonary/Chest: Effort normal and breath sounds normal. No respiratory distress. She has no wheezes. She has no rales.  Abdominal: Soft. She exhibits no distension. There is no hepatosplenomegaly. There is tenderness in the right upper quadrant. There is positive Murphy's sign. There is no rebound and no  guarding.  Musculoskeletal: Normal range of motion. She exhibits no edema or tenderness.  Neurological: She is alert and oriented to person, place, and time.  Skin: Skin is warm and dry. No rash noted. No erythema.  Psychiatric: She has a normal mood and affect. Her behavior is normal.  Nursing note and vitals reviewed.    ED Treatments / Results  Labs (all labs ordered are listed, but only abnormal results are displayed) Labs Reviewed  CBC WITH DIFFERENTIAL/PLATELET - Abnormal; Notable for the following components:      Result Value   WBC 3.8 (*)    MCH 34.3 (*)    All other components within normal limits  COMPREHENSIVE METABOLIC PANEL - Abnormal; Notable for the following components:   Calcium 8.8 (*)    AST 67 (*)    ALT 75 (*)    All other components within normal limits  LIPASE, BLOOD    EKG None  Radiology US Abdomen Limited Ruq  Result Date: 08/27/2017 CLINICAL DATA:  Right upper quadrant pain, nausea EXAM: ULTRASOUND ABDOMEN LIMITED RIGHT UPPER QUADRANT COMPARISON:  None. FINDINGS: Gallbladder: No gallbladder wall thickening or pericholecystic fluid. Multiple layering small non-shadowing calculi measuring up to 3 mm. Sonographer reports no sonographic Murphy's sign. Common bile duct: Diameter: 2 mm, unremarkable Liver: 1.5 cm probable benign cyst in the left lobe. No other focal lesion identified. Within normal limits in parenchymal echogenicity. Portal vein is patent on color Doppler imaging with normal direction of blood flow towards the liver. IMPRESSION: 1. Cholelithiasis without other ultrasound evidence of cholecystitis or biliary obstruction. 2. Probable benign left hepatic cyst 1.5 cm. Electronically Signed   By: Lucrezia Europe M.D.   On: 08/27/2017 13:43    Procedures Procedures (including critical care time)  Medications Ordered in ED Medications  ondansetron (ZOFRAN) injection 4 mg (has no administration in time range)  sodium chloride 0.9 % bolus 1,000 mL  (has no administration in time range)  0.9 %  sodium chloride infusion (has no administration in time range)     Initial Impression / Assessment and Plan / ED Course  I have reviewed the triage vital signs and the nursing notes.  Pertinent labs & imaging results that were available during my care of the patient were reviewed by me and considered in my medical decision making (see chart for details).     Patient is a healthy 47 year old female who is presenting today with persistent right-sided domino pain that started off intermittently but has been persistent since yesterday.  Pain is made worse with eating.  She has had significant nausea but no vomiting.  She did recently go on vacation but states she was not out of the country and did not eat anything out of the norm.  There is no one else feeling ill.  She does drink 2 to 3 glasses of wine per week but has not had any alcohol since the pain started.  Only prior abdominal surgeries a C-section.  Patient was initially seen at the [facility] walk-in clinic and sent here for further evaluation.  Concern for possible cholecystitis.  Also could be hepatitis or pancreatitis.  Lower suspicion for chest or cardiac etiology.  Patient given IV fluids and Zofran. CBC, CMP, lipase, right upper quadrant pending.  2:02 PM Labs with mild LFT elevation but normal lipase.  US showing cholelithiasis.  On repeat exam pt's pain is worse.  Pt given morphine.  Will consult surgery  2:39 PM Cussed with Dr. Lucia Gaskins.  Patient will go by private vehicle to [facility] emergency room to be seen by surgery and admitted for cholecystitis with persistent pain.  Patient can receive antibiotics upon arrival there.  Final Clinical Impressions(s) / ED Diagnoses   Final diagnoses:  RUQ pain  Calculus of gallbladder with acute cholecystitis without obstruction    ED Discharge Orders    None       Blanchie Dessert, MD 08/27/17 1439

## 2017-08-27 NOTE — ED Notes (Signed)
ED TO INPATIENT HANDOFF REPORT  Name/Age/Gender Raven Gomez 47 y.o. female  Code Status   Home/SNF/Other Home  Chief Complaint abdominal pain  Level of Care/Admitting Diagnosis ED Disposition    ED Disposition Condition [facility] Area: [place] [[id number]]  Level of Care: Med-Surg [16]  Diagnosis: Cholecystitis [601093]  Admitting Physician: Markesan, Medina  Attending Physician: Lucia Gaskins, DAVID [[id number]]  Estimated length of stay: past midnight tomorrow  Certification:: I certify this patient will need inpatient services for at least 2 midnights  PT Class (Do Not Modify): Inpatient [101]  PT Acc Code (Do Not Modify): Private [1]       Medical History Past Medical History:  Diagnosis Date  . 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 Contraception management 11/1986    Allergies No Known Allergies  IV Location/Drains/Wounds Patient Lines/Drains/Airways Status   Active Line/Drains/Airways    Name:   Placement date:   Placement time:   Site:   Days:   Peripheral IV 08/27/17 Right Hand   08/27/17    1851    Hand   less than 1          Labs/Imaging Results for orders placed or performed during the hospital encounter of 08/27/17 (from the past 48 hour(s))  CBC with Differential/Platelet     Status: Abnormal   Collection Time: 08/27/17  1:12 PM  Result Value Ref Range   WBC 3.8 (L) 4.0 - 10.5 K/uL   RBC 4.23 3.87 - 5.11 MIL/uL   Hemoglobin 14.5 12.0 - 15.0 g/dL   HCT 40.9 36.0 - 46.0 %   MCV 96.7 78.0 - 100.0 fL   MCH 34.3 (H) 26.0 - 34.0 pg   MCHC 35.5 30.0 - 36.0 g/dL   RDW 12.5 11.5 - 15.5 %   Platelets 215 150 - 400 K/uL   Neutrophils Relative % 51 %   Neutro Abs 2.0 1.7 - 7.7 K/uL   Lymphocytes Relative 32 %   Lymphs Abs 1.2 0.7 - 4.0 K/uL   Monocytes Relative 15 %   Monocytes Absolute 0.6 0.1 - 1.0 K/uL   Eosinophils Relative 2 %   Eosinophils Absolute 0.1 0.0 - 0.7 K/uL   Basophils Relative 0 %   Basophils Absolute 0.0 0.0 - 0.1 K/uL    Comment: Performed at [facility], [address] [place], [place] [address]  Comprehensive metabolic panel     Status: Abnormal   Collection Time: 08/27/17  1:12 PM  Result Value Ref Range   Sodium 140 135 - 145 mmol/L   Potassium 4.3 3.5 - 5.1 mmol/L   Chloride 105 98 - 111 mmol/L    Comment: Please note change in reference range.   CO2 26 22 - 32 mmol/L   Glucose, Bld 99 70 - 99 mg/dL    Comment: Please note change in reference range.   BUN 8 6 - 20 mg/dL    Comment: Please note change in reference range.   Creatinine, Ser 0.71 0.44 - 1.00 mg/dL   Calcium 8.8 (L) 8.9 - 10.3 mg/dL   Total Protein 7.0 6.5 - 8.1 g/dL   Albumin 4.2 3.5 - 5.0 g/dL   AST 67 (H) 15 - 41 U/L   ALT 75 (H) 0 - 44 U/L    Comment: Please note change in reference range.   Alkaline Phosphatase 79 38 - 126 U/L   Total Bilirubin 0.6 0.3 - 1.2 mg/dL  GFR calc non Af Amer >60 >60 mL/min   GFR calc Af Amer >60 >60 mL/min    Comment: (NOTE) The eGFR has been calculated using the CKD EPI equation. This calculation has not been validated in all clinical situations. eGFR's persistently <60 mL/min signify possible Chronic Kidney Disease.    Anion gap 9 5 - 15    Comment: Performed at [facility], [address] [place], [place] [address]  Lipase, blood     Status: None   Collection Time: 08/27/17  1:12 PM  Result Value Ref Range   Lipase 29 11 - 51 U/L    Comment: Performed at [facility], [address] [place], [place] [address]   US Abdomen Limited Ruq  Result Date: 08/27/2017 CLINICAL DATA:  Right upper quadrant pain, nausea EXAM: ULTRASOUND ABDOMEN LIMITED RIGHT UPPER QUADRANT COMPARISON:  None. FINDINGS: Gallbladder: No gallbladder wall thickening or pericholecystic fluid. Multiple layering small non-shadowing calculi measuring up to 3 mm. Sonographer reports no sonographic Murphy's sign. Common bile duct: Diameter: 2 mm,  unremarkable Liver: 1.5 cm probable benign cyst in the left lobe. No other focal lesion identified. Within normal limits in parenchymal echogenicity. Portal vein is patent on color Doppler imaging with normal direction of blood flow towards the liver. IMPRESSION: 1. Cholelithiasis without other ultrasound evidence of cholecystitis or biliary obstruction. 2. Probable benign left hepatic cyst 1.5 cm. Electronically Signed   By: Lucrezia Europe M.D.   On: 08/27/2017 13:43    Pending Labs Unresulted Labs (From admission, onward)   None      Vitals/Pain Today's Vitals   08/27/17 1930 08/27/17 2000 08/27/17 2050 08/27/17 2100  BP: 109/66 106/68 117/86 123/81  Pulse: 71 72 74 71  Resp:   18   Temp:   (!) 97.5 F (36.4 C)   TempSrc:   Oral   SpO2: 99% 99% 100% 100%  Weight:      Height:      PainSc:        Isolation Precautions No active isolations  Medications Medications  0.9 %  sodium chloride infusion ( Intravenous New Bag/Given 08/27/17 1830)  morphine 4 MG/ML injection 4 mg (4 mg Intravenous Not Given 08/27/17 1449)  ondansetron (ZOFRAN) injection 4 mg (4 mg Intravenous Given 08/27/17 1313)  sodium chloride 0.9 % bolus 1,000 mL (0 mLs Intravenous Stopped 08/27/17 1439)  morphine 4 MG/ML injection 4 mg (4 mg Intravenous Given 08/27/17 1830)    Mobility walks

## 2017-08-27 NOTE — ED Provider Notes (Signed)
[facility] DEPT Provider Note   CSN: [id number] Arrival date & time: 08/27/17  1208     History   Chief Complaint Chief Complaint  Patient presents with  . Abdominal Pain    HPI Raven Gomez is a 47 y.o. female presenting for evaluation of abdominal pain.  Patient sent from [facility] for surgery evaluation and management for cholecystitis.  Patient reports continued pain, nausea has improved.  Otherwise no new or concerning symptoms since leaving [place].  Last oral intake yesterday.  HPI  Past Medical History:  Diagnosis Date  . 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 Contraception management 11/1986    There are no active problems to display for this patient.   Past Surgical History:  Procedure Laterality Date  . CESAREAN SECTION    . WISDOM TOOTH EXTRACTION  2008     OB History   None      Home Medications    Prior to Admission medications   Medication Sig Start Date End Date Taking? Authorizing Provider  amphetamine-dextroamphetamine (ADDERALL XR) 10 MG 24 hr capsule Take 10 mg by mouth daily.    [provider]  DULoxetine (CYMBALTA) 60 MG capsule Take 60 mg by mouth daily.    [provider]  fluticasone (FLONASE) 50 MCG/ACT nasal spray Place into both nostrils daily.    [provider]  MICROGESTIN FE 1/20 1-20 MG-MCG tablet TK 1 T PO QD. SKIP PLACEBO PILLS AND TK ACTIVE PILLS CONTINUOUSLY 01/11/15   [provider]  triamcinolone (KENALOG) 0.1 % paste Use as directed 1 application in the mouth or throat 2 (two) times daily. 02/04/16   Girtha Rm, NP-C    Family History No family history on file.  Social History Social History   Tobacco Use  . Smoking status: Never Smoker  . Smokeless tobacco: Never Used  Substance Use Topics  . Alcohol use: Yes    Comment: 3-5 glasses of wine per week  . Drug use: No     Allergies   Patient has no known  allergies.   Review of Systems Review of Systems  Constitutional: Negative for fever.  Gastrointestinal: Positive for abdominal pain.     Physical Exam Updated Vital Signs BP 128/76 (BP Location: Right Arm)   Pulse 82   Temp 98 F (36.7 C) (Oral)   Resp 17   Ht 4\' 11"  (1.499 m)   Wt 58.5 kg (129 lb)   LMP 08/08/2017   SpO2 99%   BMI 26.05 kg/m   Physical Exam  Constitutional: She is oriented to person, place, and time. She appears well-developed and well-nourished. No distress.  Appears mildly uncomfortable but in no acute distress.  HENT:  Head: Normocephalic and atraumatic.  Eyes: EOM are normal.  Neck: Normal range of motion.  Cardiovascular: Normal rate, regular rhythm and intact distal pulses.  Pulmonary/Chest: Effort normal and breath sounds normal. No respiratory distress. She has no wheezes.  Abdominal: Soft. She exhibits no distension. There is tenderness.  TTP of RUQ  Musculoskeletal: Normal range of motion.  Neurological: She is alert and oriented to person, place, and time.  Skin: Skin is warm. Capillary refill takes less than 2 seconds. No rash noted.  Psychiatric: She has a normal mood and affect.  Nursing note and vitals reviewed.    ED Treatments / Results  Labs (all labs ordered are listed, but only abnormal results are displayed) Labs Reviewed  CBC WITH DIFFERENTIAL/PLATELET - Abnormal; Notable for the following components:      Result Value   WBC 3.8 (*)    MCH 34.3 (*)    All other components within normal limits  COMPREHENSIVE METABOLIC PANEL - Abnormal; Notable for the following components:   Calcium 8.8 (*)    AST 67 (*)    ALT 75 (*)    All other components within normal limits  LIPASE, BLOOD    EKG None  Radiology US Abdomen Limited Ruq  Result Date: 08/27/2017 CLINICAL DATA:  Right upper quadrant pain, nausea EXAM: ULTRASOUND ABDOMEN LIMITED RIGHT UPPER QUADRANT COMPARISON:  None. FINDINGS: Gallbladder: No gallbladder wall  thickening or pericholecystic fluid. Multiple layering small non-shadowing calculi measuring up to 3 mm. Sonographer reports no sonographic Murphy's sign. Common bile duct: Diameter: 2 mm, unremarkable Liver: 1.5 cm probable benign cyst in the left lobe. No other focal lesion identified. Within normal limits in parenchymal echogenicity. Portal vein is patent on color Doppler imaging with normal direction of blood flow towards the liver. IMPRESSION: 1. Cholelithiasis without other ultrasound evidence of cholecystitis or biliary obstruction. 2. Probable benign left hepatic cyst 1.5 cm. Electronically Signed   By: Lucrezia Europe M.D.   On: 08/27/2017 13:43    Procedures Procedures (including critical care time)  Medications Ordered in ED Medications  0.9 %  sodium chloride infusion ( Intravenous Not Given 08/27/17 1453)  morphine 4 MG/ML injection 4 mg (4 mg Intravenous Not Given 08/27/17 1449)  morphine 4 MG/ML injection 4 mg (has no administration in time range)  ondansetron (ZOFRAN) injection 4 mg (4 mg Intravenous Given 08/27/17 1313)  sodium chloride 0.9 % bolus 1,000 mL (0 mLs Intravenous Stopped 08/27/17 1439)     Initial Impression / Assessment and Plan / ED Course  I have reviewed the triage vital signs and the nursing notes.  Pertinent labs & imaging results that were available during my care of the patient were reviewed by me and considered in my medical decision making (see chart for details).     Patient is in for [place] for cholecystitis.  Dr. Lucia Gaskins with general surgery consulted prior to transfer.  Upon arrival to [facility], patient evaluated and Dr. Lucia Gaskins informed that patient is in the department.  Will give morphine for pain control.  No other concerns at this time.  Patient to be admitted to surgery service.  Final Clinical Impressions(s) / ED Diagnoses   Final diagnoses:  RUQ pain  Calculus of gallbladder with acute cholecystitis without obstruction    ED  Discharge Orders    None       Franchot Heidelberg, PA-C 08/27/17 1640    Drenda Freeze, MD 08/27/17 [phone]

## 2017-08-27 NOTE — ED Notes (Signed)
ED Provider at bedside. 

## 2017-08-27 NOTE — Anesthesia Preprocedure Evaluation (Addendum)
Anesthesia Evaluation  Patient identified by MRN, date of birth, ID band Patient awake    Reviewed: Allergy & Precautions, NPO status , Patient's Chart, lab work & pertinent test results  Airway Mallampati: III  TM Distance: >3 FB Neck ROM: Full    Dental  (+) Teeth Intact, Dental Advisory Given   Pulmonary neg pulmonary ROS,    breath sounds clear to auscultation       Cardiovascular negative cardio ROS   Rhythm:Regular Rate:Normal     Neuro/Psych negative neurological ROS  negative psych ROS   GI/Hepatic negative GI ROS, Neg liver ROS,   Endo/Other  negative endocrine ROS  Renal/GU negative Renal ROS     Musculoskeletal   Abdominal Normal abdominal exam  (+)   Peds  Hematology negative hematology ROS (+)   Anesthesia Other Findings   Reproductive/Obstetrics                            Anesthesia Physical Anesthesia Plan  ASA: II  Anesthesia Plan: General   Post-op Pain Management:    Induction: Intravenous  PONV Risk Score and Plan: 4 or greater and Ondansetron, Dexamethasone, Midazolam and Scopolamine patch - Pre-op  Airway Management Planned: Oral ETT  Additional Equipment: None  Intra-op Plan:   Post-operative Plan: Extubation in OR  Informed Consent: I have reviewed the patients History and Physical, chart, labs and discussed the procedure including the risks, benefits and alternatives for the proposed anesthesia with the patient or authorized representative who has indicated his/her understanding and acceptance.   Dental advisory given  Plan Discussed with: CRNA  Anesthesia Plan Comments:        Lab Results  Component Value Date   WBC 3.8 (L) 08/27/2017   HGB 14.5 08/27/2017   HCT 40.9 08/27/2017   MCV 96.7 08/27/2017   PLT 215 08/27/2017   Lab Results  Component Value Date   CREATININE 0.71 08/27/2017   BUN 8 08/27/2017   NA 140 08/27/2017   K 4.3  08/27/2017   CL 105 08/27/2017   CO2 26 08/27/2017   No results found for: INR, PROTIME   Anesthesia Quick Evaluation

## 2017-08-28 ENCOUNTER — Encounter (HOSPITAL_COMMUNITY): Admission: EM | Disposition: A | Discharge status: Home / Self Care | Payer: Self-pay | Source: Home / Self Care

## 2017-08-28 ENCOUNTER — Inpatient Hospital Stay (HOSPITAL_COMMUNITY): Payer: 59 | Admitting: Anesthesiology

## 2017-08-28 ENCOUNTER — Inpatient Hospital Stay (HOSPITAL_COMMUNITY): Payer: 59

## 2017-08-28 HISTORY — PX: CHOLECYSTECTOMY: SHX55

## 2017-08-28 LAB — SURGICAL PCR SCREEN
MRSA, PCR: NEGATIVE
Staphylococcus aureus: NEGATIVE

## 2017-08-28 SURGERY — LAPAROSCOPIC CHOLECYSTECTOMY WITH INTRAOPERATIVE CHOLANGIOGRAM
Anesthesia: General | Site: Abdomen

## 2017-08-28 MED ORDER — ROCURONIUM BROMIDE 10 MG/ML (PF) SYRINGE
PREFILLED_SYRINGE | INTRAVENOUS | Status: AC
  Filled 2017-08-28: qty 10

## 2017-08-28 MED ORDER — LACTATED RINGERS IR SOLN
Status: DC | PRN
  Administered 2017-08-28: 3000 mL

## 2017-08-28 MED ORDER — HYDROMORPHONE HCL 1 MG/ML IJ SOLN
INTRAMUSCULAR | Status: AC
  Filled 2017-08-28: qty 1

## 2017-08-28 MED ORDER — MIDAZOLAM HCL 2 MG/2ML IJ SOLN
INTRAMUSCULAR | Status: AC
  Filled 2017-08-28: qty 2

## 2017-08-28 MED ORDER — PROPOFOL 10 MG/ML IV BOLUS
INTRAVENOUS | Status: AC
  Filled 2017-08-28: qty 20

## 2017-08-28 MED ORDER — HYDROMORPHONE HCL 1 MG/ML IJ SOLN
0.2500 mg | INTRAMUSCULAR | Status: DC | PRN
  Administered 2017-08-28 (×4): 0.5 mg via INTRAVENOUS

## 2017-08-28 MED ORDER — IOPAMIDOL (ISOVUE-300) INJECTION 61%
INTRAVENOUS | Status: AC
  Filled 2017-08-28: qty 50

## 2017-08-28 MED ORDER — DEXAMETHASONE SODIUM PHOSPHATE 10 MG/ML IJ SOLN
INTRAMUSCULAR | Status: AC
  Filled 2017-08-28: qty 1

## 2017-08-28 MED ORDER — 0.9 % SODIUM CHLORIDE (POUR BTL) OPTIME
TOPICAL | Status: DC | PRN
  Administered 2017-08-28: 1000 mL

## 2017-08-28 MED ORDER — MEPERIDINE HCL 50 MG/ML IJ SOLN: 6.2500 mg | INTRAMUSCULAR | Status: DC | PRN

## 2017-08-28 MED ORDER — MEPERIDINE HCL 25 MG/ML IJ SOLN: 6.2500 mg | INTRAMUSCULAR | Status: DC | PRN

## 2017-08-28 MED ORDER — FENTANYL CITRATE (PF) 250 MCG/5ML IJ SOLN
INTRAMUSCULAR | Status: AC
  Filled 2017-08-28: qty 5

## 2017-08-28 MED ORDER — LACTATED RINGERS IV SOLN
INTRAVENOUS | Status: DC
  Administered 2017-08-28: 12:00:00 via INTRAVENOUS

## 2017-08-28 MED ORDER — FENTANYL CITRATE (PF) 100 MCG/2ML IJ SOLN
INTRAMUSCULAR | Status: DC | PRN
  Administered 2017-08-28 (×2): 100 ug via INTRAVENOUS

## 2017-08-28 MED ORDER — AMPHETAMINE-DEXTROAMPHET ER 5 MG PO CP24
15.0000 mg | ORAL_CAPSULE | Freq: Every day | ORAL | Status: DC
  Filled 2017-08-28: qty 3

## 2017-08-28 MED ORDER — BUPIVACAINE-EPINEPHRINE 0.25% -1:200000 IJ SOLN
INTRAMUSCULAR | Status: DC | PRN
  Administered 2017-08-28: 30 mL

## 2017-08-28 MED ORDER — SODIUM CHLORIDE 0.9 % IV SOLN
INTRAVENOUS | Status: DC | PRN
  Administered 2017-08-28: 10 mL

## 2017-08-28 MED ORDER — SUGAMMADEX SODIUM 200 MG/2ML IV SOLN
INTRAVENOUS | Status: AC
  Filled 2017-08-28: qty 2

## 2017-08-28 MED ORDER — IBUPROFEN 200 MG PO TABS
600.0000 mg | ORAL_TABLET | Freq: Four times a day (QID) | ORAL | Status: DC | PRN
  Filled 2017-08-28: qty 3

## 2017-08-28 MED ORDER — DEXAMETHASONE SODIUM PHOSPHATE 10 MG/ML IJ SOLN
INTRAMUSCULAR | Status: DC | PRN
  Administered 2017-08-28: 10 mg via INTRAVENOUS

## 2017-08-28 MED ORDER — LACTATED RINGERS IV SOLN
INTRAVENOUS | Status: DC | PRN
  Administered 2017-08-28: 08:00:00 via INTRAVENOUS

## 2017-08-28 MED ORDER — BUPIVACAINE-EPINEPHRINE (PF) 0.25% -1:200000 IJ SOLN
INTRAMUSCULAR | Status: AC
  Filled 2017-08-28: qty 30

## 2017-08-28 MED ORDER — MIDAZOLAM HCL 5 MG/5ML IJ SOLN
INTRAMUSCULAR | Status: DC | PRN
  Administered 2017-08-28: 2 mg via INTRAVENOUS

## 2017-08-28 MED ORDER — ONDANSETRON HCL 4 MG/2ML IJ SOLN
INTRAMUSCULAR | Status: DC | PRN
  Administered 2017-08-28: 4 mg via INTRAVENOUS

## 2017-08-28 MED ORDER — FLUOXETINE HCL 10 MG PO CAPS
10.0000 mg | ORAL_CAPSULE | Freq: Every day | ORAL | Status: DC
  Administered 2017-08-28 – 2017-08-29 (×2): 10 mg via ORAL
  Filled 2017-08-28 (×2): qty 1

## 2017-08-28 MED ORDER — PROMETHAZINE HCL 25 MG/ML IJ SOLN: 6.2500 mg | INTRAMUSCULAR | Status: DC | PRN

## 2017-08-28 MED ORDER — CHLORHEXIDINE GLUCONATE CLOTH 2 % EX PADS: 6.0000 | MEDICATED_PAD | Freq: Every day | CUTANEOUS | Status: DC

## 2017-08-28 MED ORDER — PROMETHAZINE HCL 25 MG/ML IJ SOLN
6.2500 mg | INTRAMUSCULAR | Status: AC | PRN
  Administered 2017-08-28 (×2): 12.5 mg via INTRAVENOUS
  Filled 2017-08-28 (×2): qty 1

## 2017-08-28 MED ORDER — LIDOCAINE 2% (20 MG/ML) 5 ML SYRINGE
INTRAMUSCULAR | Status: AC
  Filled 2017-08-28: qty 5

## 2017-08-28 MED ORDER — ONDANSETRON HCL 4 MG/2ML IJ SOLN
INTRAMUSCULAR | Status: AC
  Filled 2017-08-28: qty 2

## 2017-08-28 MED ORDER — MUPIROCIN 2 % EX OINT: 1.0000 "application " | TOPICAL_OINTMENT | Freq: Two times a day (BID) | CUTANEOUS | Status: DC

## 2017-08-28 MED ORDER — LACTATED RINGERS IV SOLN: INTRAVENOUS | Status: DC

## 2017-08-28 MED ORDER — KCL IN DEXTROSE-NACL 20-5-0.45 MEQ/L-%-% IV SOLN
INTRAVENOUS | Status: DC
  Administered 2017-08-28 (×2): via INTRAVENOUS
  Filled 2017-08-28 (×4): qty 1000

## 2017-08-28 MED ORDER — HYDROMORPHONE HCL 1 MG/ML IJ SOLN: 0.2500 mg | INTRAMUSCULAR | Status: DC | PRN

## 2017-08-28 SURGICAL SUPPLY — 37 items
APPLIER CLIP 5 13 M/L LIGAMAX5 (MISCELLANEOUS) ×2
APPLIER CLIP ROT 10 11.4 M/L (STAPLE)
BENZOIN TINCTURE PRP APPL 2/3 (GAUZE/BANDAGES/DRESSINGS) IMPLANT
CABLE HIGH FREQUENCY MONO STRZ (ELECTRODE) ×2 IMPLANT
CHLORAPREP W/TINT 26ML (MISCELLANEOUS) ×2 IMPLANT
CHOLANGIOGRAM CATH TAUT (CATHETERS) ×2 IMPLANT
CLIP APPLIE 5 13 M/L LIGAMAX5 (MISCELLANEOUS) ×1 IMPLANT
CLIP APPLIE ROT 10 11.4 M/L (STAPLE) IMPLANT
COVER MAYO STAND STRL (DRAPES) ×2 IMPLANT
COVER SURGICAL LIGHT HANDLE (MISCELLANEOUS) ×2 IMPLANT
DECANTER SPIKE VIAL GLASS SM (MISCELLANEOUS) ×2 IMPLANT
DERMABOND ADVANCED (GAUZE/BANDAGES/DRESSINGS) ×1
DERMABOND ADVANCED .7 DNX12 (GAUZE/BANDAGES/DRESSINGS) ×1 IMPLANT
DRAPE C-ARM 42X120 X-RAY (DRAPES) ×2 IMPLANT
ELECT REM PT RETURN 15FT ADLT (MISCELLANEOUS) ×2 IMPLANT
GLOVE SURG SIGNA 7.5 PF LTX (GLOVE) ×2 IMPLANT
GOWN STRL REUS W/TWL XL LVL3 (GOWN DISPOSABLE) ×6 IMPLANT
HEMOSTAT SURGICEL 4X8 (HEMOSTASIS) IMPLANT
IV CATH 14GX2 1/4 (CATHETERS) ×2 IMPLANT
IV SET EXTENSION CATH 6 NF (IV SETS) ×2 IMPLANT
KIT BASIN OR (CUSTOM PROCEDURE TRAY) ×2 IMPLANT
POUCH RETRIEVAL ECOSAC 10 (ENDOMECHANICALS) ×1 IMPLANT
POUCH RETRIEVAL ECOSAC 10MM (ENDOMECHANICALS) ×1
SCISSORS LAP 5X35 DISP (ENDOMECHANICALS) ×2 IMPLANT
SET IRRIG TUBING LAPAROSCOPIC (IRRIGATION / IRRIGATOR) ×2 IMPLANT
SLEEVE ADV FIXATION 5X100MM (TROCAR) ×2 IMPLANT
STOPCOCK 4 WAY LG BORE MALE ST (IV SETS) ×2 IMPLANT
STRIP CLOSURE SKIN 1/4X4 (GAUZE/BANDAGES/DRESSINGS) IMPLANT
SUT MNCRL AB 4-0 PS2 18 (SUTURE) ×2 IMPLANT
SYR 10ML ECCENTRIC (SYRINGE) ×2 IMPLANT
TOWEL OR 17X26 10 PK STRL BLUE (TOWEL DISPOSABLE) ×2 IMPLANT
TOWEL OR NON WOVEN STRL DISP B (DISPOSABLE) ×2 IMPLANT
TRAY LAPAROSCOPIC (CUSTOM PROCEDURE TRAY) ×2 IMPLANT
TROCAR ADV FIXATION 11X100MM (TROCAR) ×2 IMPLANT
TROCAR ADV FIXATION 5X100MM (TROCAR) ×2 IMPLANT
TROCAR XCEL BLUNT TIP 100MML (ENDOMECHANICALS) ×2 IMPLANT
TUBING INSUF HEATED (TUBING) ×2 IMPLANT

## 2017-08-28 NOTE — Anesthesia Postprocedure Evaluation (Signed)
Anesthesia Post Note  Patient: Raven Gomez  Procedure(s) Performed: LAPAROSCOPIC CHOLECYSTECTOMY WITH INTRAOPERATIVE CHOLANGIOGRAM (N/A Abdomen)     Patient location during evaluation: PACU Anesthesia Type: General Level of consciousness: awake and alert Pain management: pain level controlled Vital Signs Assessment: post-procedure vital signs reviewed and stable Respiratory status: spontaneous breathing, nonlabored ventilation, respiratory function stable and patient connected to nasal cannula oxygen Cardiovascular status: blood pressure returned to baseline and stable Postop Assessment: no apparent nausea or vomiting Anesthetic complications: no    Last Vitals:  Vitals:   08/28/17 1019 08/28/17 1022  BP: 126/79 126/79  Pulse: 76 76  Resp: 16   Temp: 36.4 C 36.4 C  SpO2: 100% 100%    Last Pain:  Vitals:   08/28/17 1022  TempSrc: Oral  PainSc:                  Effie Berkshire

## 2017-08-28 NOTE — Transfer of Care (Signed)
Immediate Anesthesia Transfer of Care Note  Patient: Raven Gomez  Procedure(s) Performed: LAPAROSCOPIC CHOLECYSTECTOMY WITH INTRAOPERATIVE CHOLANGIOGRAM (N/A Abdomen)  Patient Location: PACU  Anesthesia Type:General  Level of Consciousness: awake, alert  and oriented  Airway & Oxygen Therapy: Patient Spontanous Breathing and Patient connected to face mask oxygen  Post-op Assessment: Report given to RN and Post -op Vital signs reviewed and stable  Post vital signs: Reviewed and stable  Last Vitals:  Vitals Value Taken Time  BP 149/90 08/28/2017  9:00 AM  Temp    Pulse 79 08/28/2017  9:01 AM  Resp 16 08/28/2017  9:01 AM  SpO2 100 % 08/28/2017  9:01 AM  Vitals shown include unvalidated device data.  Last Pain:  Vitals:   08/28/17 0500  TempSrc:   PainSc: Asleep         Complications: No apparent anesthesia complications

## 2017-08-28 NOTE — Anesthesia Procedure Notes (Signed)
Procedure Name: Intubation Date/Time: 08/28/2017 7:52 AM Performed by: Xitlaly Ault D, CRNA Pre-anesthesia Checklist: Patient identified, Emergency Drugs available, Suction available and Patient being monitored Patient Re-evaluated:Patient Re-evaluated prior to induction Oxygen Delivery Method: Circle system utilized Preoxygenation: Pre-oxygenation with 100% oxygen Induction Type: IV induction Ventilation: Mask ventilation without difficulty Laryngoscope Size: Mac and 3 Grade View: Grade I Tube type: Oral Number of attempts: 1 Airway Equipment and Method: Stylet Placement Confirmation: ETT inserted through vocal cords under direct vision,  positive ETCO2 and breath sounds checked- equal and bilateral Secured at: 21 cm Tube secured with: Tape Dental Injury: Teeth and Oropharynx as per pre-operative assessment

## 2017-08-28 NOTE — Op Note (Signed)
08/28/2017  8:54 AM  PATIENT:  Raven Gomez, 47 y.o., female, MRN: [id number]  PREOP DIAGNOSIS:  cholecystitis  POSTOP DIAGNOSIS:   Mild cholecystitis, cholelithiasis  PROCEDURE:   Procedure(s):  LAPAROSCOPIC CHOLECYSTECTOMY WITH INTRAOPERATIVE CHOLANGIOGRAM  SURGEON:   Alphonsa Overall, M.D.  ASSISTANT:   P. Marlou Starks, M.D.  ANESTHESIA:   general  Anesthesiologist: Effie Berkshire, MD CRNA: Marijo Conception, CRNA  General  ASA: 2 E  EBL:  Minimal  ml  BLOOD ADMINISTERED: none  DRAINS: none   LOCAL MEDICATIONS USED:   30 cc of 1/4% marcaine  SPECIMEN:   Gall bladder  COUNTS CORRECT:  YES  INDICATIONS FOR PROCEDURE:  Raven Gomez is a 47 y.o. (DOB: 03-13-1970) white female whose primary care physician is Ripley, Vickie L, NP-C and comes for cholecystectomy.   The indications and risks of the gall bladder surgery were explained to the patient.  The risks include, but are not limited to, infection, bleeding, common bile duct injury and open surgery.  SURGERY:  The patient was taken to OR room #[facility] at [facility].  The abdomen was prepped with chloroprep.  The patient was given 2 gm Rocephin prior to the beginning of the operation.   A time out was held and the surgical checklist run.   An infraumbilical incision was made into the abdominal cavity.  A 12 mm Hasson trocar was inserted into the abdominal cavity through the infraumbilical incision and secured with a 0 Vicryl suture.  Three additional trocars were inserted: a 10 mm trocar in the sub-xiphoid location, a 5 mm trocar in the right mid subcostal area, and a 5 mm trocar in the right lateral subcostal area.   The abdomen was explored and the liver, stomach, and bowel that could be seen were unremarkable.  Her pelvic organs and ovaries were normal.  There was not other obvious intra-abdominal process to explain her pain.   The gall bladder was distended and minimally inflamed.   I grasped the gall bladder and  rotated it cephalad.  Disssection was carried down to the gall bladder/cystic duct junction and the cystic duct isolated.  A clip was placed on the gall bladder side of the cystic duct.   An intra-operative cholangiogram was shot.   The intra-operative cholangiogram was shot using a cut off Taut catheter placed through a 14 gauge angiocath in the RUQ.  The Taut catheter was inserted in the cut cystic duct and secured with an endoclip.  A cholangiogram was shot with 8 cc of 1/2 strength Isoview.  Using fluoroscopy, the cholangiogram showed the flow of contrast into the common bile duct, up the hepatic radicals, and into the duodenum.  There was no mass or obstruction.  This was a normal intra-operative cholangiogram.   The Taut catheter was removed.  The cystic duct was tripley endoclipped and the cystic artery was identified and clipped.  The gall bladder was bluntly and sharpley dissected from the gall bladder bed.  I did spill bile from the gall bladder making this a class 3 wound.   After the gall bladder was removed from the liver, the gall bladder bed and Tri[place] of [place] were inspected.  There was no bleeding or bile leak.  The gall bladder was placed in a Ecco Sac bag and delivered through the umbilicus.  The abdomen was irrigated with 2,000 cc saline.   The trocars were then removed.  I infiltrated 30 cc of 1/4% Marcaine into the incisions.  The umbilical port closed with a 0 Vicryl suture and the skin closed with 4-0 Monocryl.  The skin was painted with DermaBond.     I have a surgeon as a first assist to retract, expose, and assist on this difficult operation.   The patient's sponge and needle count were correct.  The patient was transported to the RR in good condition.  Alphonsa Overall, MD, [facility] Pager: [phone] Office phone:  [phone]

## 2017-08-28 NOTE — Progress Notes (Signed)
Ready for surgery.  No further questions.  For lap chole this AM  Alphonsa Overall, MD, [facility] Pager: [phone] Office phone:  [phone]

## 2017-08-29 ENCOUNTER — Encounter (HOSPITAL_COMMUNITY): Payer: Self-pay | Admitting: Surgery

## 2017-08-29 MED ORDER — HYDROCODONE-ACETAMINOPHEN 5-325 MG PO TABS: 1.0000 | ORAL_TABLET | Freq: Four times a day (QID) | ORAL | 0 refills | Status: DC | PRN

## 2017-08-29 MED ORDER — HYDROCODONE-ACETAMINOPHEN 5-325 MG PO TABS
1.0000 | ORAL_TABLET | ORAL | Status: DC | PRN
  Administered 2017-08-29: 1 via ORAL
  Filled 2017-08-29: qty 1

## 2017-08-29 NOTE — Discharge Instructions (Signed)

## 2017-08-29 NOTE — Discharge Summary (Signed)
[facility] Discharge Summary   Patient ID: Raven Gomez MRN: [id number] DOB/AGE: [birth year] 48 y.o.  Admit date: 08/27/2017 Discharge date: 08/29/2017  Admitting Diagnosis: Acute cholecystitis  Discharge Diagnosis Patient Active Problem List   Diagnosis Date Noted  . Cholecystitis 08/27/2017    Consultants None  Imaging: Dg Cholangiogram Operative  Result Date: 08/28/2017 CLINICAL DATA:  Cholelithiasis EXAM: INTRAOPERATIVE CHOLANGIOGRAM TECHNIQUE: Cholangiographic images from the C-arm fluoroscopic device were submitted for interpretation post-operatively. Please see the procedural report for the amount of contrast and the fluoroscopy time utilized. COMPARISON:  08/27/2017 FINDINGS: Intraoperative cholangiogram performed during the procedure. Contrast extravasation at the cystic duct injection site. Common hepatic duct and common bile duct are patent. Contrast drains easily into the duodenum. No dilatation or obstruction. No stricture. IMPRESSION: Patent biliary system.  Leakage at the injection site. Electronically Signed   By: Jerilynn Mages.  Shick M.D.   On: 08/28/2017 12:16   US Abdomen Limited Ruq  Result Date: 08/27/2017 CLINICAL DATA:  Right upper quadrant pain, nausea EXAM: ULTRASOUND ABDOMEN LIMITED RIGHT UPPER QUADRANT COMPARISON:  None. FINDINGS: Gallbladder: No gallbladder wall thickening or pericholecystic fluid. Multiple layering small non-shadowing calculi measuring up to 3 mm. Sonographer reports no sonographic Murphy's sign. Common bile duct: Diameter: 2 mm, unremarkable Liver: 1.5 cm probable benign cyst in the left lobe. No other focal lesion identified. Within normal limits in parenchymal echogenicity. Portal vein is patent on color Doppler imaging with normal direction of blood flow towards the liver. IMPRESSION: 1. Cholelithiasis without other ultrasound evidence of cholecystitis or biliary obstruction. 2. Probable benign left hepatic cyst 1.5 cm.  Electronically Signed   By: Lucrezia Europe M.D.   On: 08/27/2017 13:43    Procedures Dr. Lucia Gaskins (08/28/17) - Laparoscopic Cholecystectomy with [place] Hospital Course:  Raven Gomez is a 47yo female who presented to [facility] 7/20 with 2.5 weeks of worsening epigastric pain.  Workup showed gallbladder disease, cholelithiasis.  Patient was admitted and underwent procedure listed above. Intraoperatively noted to have Mild cholecystitis, cholelithiasis; IOC showed patent biliary system. Tolerated procedure well and was transferred to the floor.  Diet was advanced as tolerated.  On POD1 the patient was voiding well, tolerating diet, ambulating well, pain well controlled, vital signs stable, incisions c/d/i and felt stable for discharge home.  Patient will follow up as below and knows to call with questions or concerns.    I have personally reviewed the patients medication history on the [place] controlled substance database.   Physical Exam: General:  Alert, NAD, pleasant, comfortable Pulm: effort normal Abd:  Soft, ND, NT, multiple lap incisions C/D/I  Allergies as of 08/29/2017   No Known Allergies     Medication List    TAKE these medications   amphetamine-dextroamphetamine 15 MG 24 hr capsule Commonly known as:  ADDERALL XR Take 15 mg by mouth daily after breakfast.   FLUoxetine 10 MG capsule Commonly known as:  PROZAC Take 10 mg by mouth daily.   HYDROcodone-acetaminophen 5-325 MG tablet Commonly known as:  NORCO/VICODIN Take 1 tablet by mouth every 6 (six) hours as needed for severe pain.        Follow-up [facility], [place]. Call.   Specialty:  General Surgery Why:  We are working on your appointment, please call to confirm. Please arrive 30 minutes prior to your appointment to check in and fill out paperwork. Bring photo ID and insurance information. Contact information: [address] [place]  [place] [phone]           Signed: Wellington Hampshire, [facility] 08/29/2017, 9:29 AM Pager: [phone] Consults: [phone] Mon 7:00 am -11:30 AM Tues-Fri 7:00 am-4:30 pm Sat-Sun 7:00 am-11:30 am

## 2017-08-29 NOTE — Progress Notes (Signed)
Patient being discharged to home today. Went over discharge instructions with patient. IV sites was removed.All questions answered and patient aware of prescriptions and need to follow up. Patient wheeled down by staff.

## 2017-09-13 ENCOUNTER — Ambulatory Visit (HOSPITAL_COMMUNITY)
Admission: RE | Admit: 2017-09-13 | Discharge: 2017-09-13 | Disposition: A | Discharge status: Home / Self Care | Payer: 59 | Source: Ambulatory Visit | Attending: Student | Admitting: Student

## 2017-09-13 ENCOUNTER — Other Ambulatory Visit (HOSPITAL_COMMUNITY): Payer: Self-pay | Admitting: Student

## 2017-09-13 DIAGNOSIS — Z9049 Acquired absence of other specified parts of digestive tract: Secondary | ICD-10-CM | POA: Diagnosis present

## 2017-09-13 DIAGNOSIS — M79601 Pain in right arm: Secondary | ICD-10-CM

## 2017-09-13 DIAGNOSIS — I82611 Acute embolism and thrombosis of superficial veins of right upper extremity: Secondary | ICD-10-CM

## 2017-09-13 DIAGNOSIS — M7989 Other specified soft tissue disorders: Principal | ICD-10-CM

## 2017-09-13 NOTE — Progress Notes (Signed)
Right upper extremity venous duplex has been completed. Negative for DVT. 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  Results were given to April at [facility] office.  09/13/17 1:10 PM Carlos Levering RVT

## 2018-02-23 ENCOUNTER — Encounter: Payer: Self-pay | Admitting: Family Medicine

## 2018-02-23 ENCOUNTER — Ambulatory Visit: Payer: BLUE CROSS/BLUE SHIELD | Admitting: Family Medicine

## 2018-02-23 VITALS — BP 118/72 | HR 82 | Temp 97.7°F | Ht 59.0 in | Wt 124.6 lb

## 2018-02-23 DIAGNOSIS — F411 Generalized anxiety disorder: Secondary | ICD-10-CM | POA: Diagnosis not present

## 2018-02-23 DIAGNOSIS — R5382 Chronic fatigue, unspecified: Secondary | ICD-10-CM

## 2018-02-23 DIAGNOSIS — Z0001 Encounter for general adult medical examination with abnormal findings: Secondary | ICD-10-CM | POA: Diagnosis not present

## 2018-02-23 DIAGNOSIS — F988 Other specified behavioral and emotional disorders with onset usually occurring in childhood and adolescence: Secondary | ICD-10-CM

## 2018-02-23 DIAGNOSIS — Z0000 Encounter for general adult medical examination without abnormal findings: Secondary | ICD-10-CM | POA: Diagnosis not present

## 2018-02-23 DIAGNOSIS — R5383 Other fatigue: Secondary | ICD-10-CM | POA: Diagnosis not present

## 2018-02-23 LAB — CBC WITH DIFFERENTIAL/PLATELET
BASOS PCT: 0.2 % (ref 0.0–3.0)
Basophils Absolute: 0 10*3/uL (ref 0.0–0.1)
EOS ABS: 0 10*3/uL (ref 0.0–0.7)
EOS PCT: 1.2 % (ref 0.0–5.0)
HEMATOCRIT: 38.2 % (ref 36.0–46.0)
HEMOGLOBIN: 13.2 g/dL (ref 12.0–15.0)
Lymphocytes Relative: 30.6 % (ref 12.0–46.0)
Lymphs Abs: 0.9 10*3/uL (ref 0.7–4.0)
MCHC: 34.5 g/dL (ref 30.0–36.0)
MCV: 95.6 fl (ref 78.0–100.0)
Monocytes Absolute: 0.4 10*3/uL (ref 0.1–1.0)
Monocytes Relative: 12.2 % — ABNORMAL HIGH (ref 3.0–12.0)
Neutro Abs: 1.7 10*3/uL (ref 1.4–7.7)
Neutrophils Relative %: 55.8 % (ref 43.0–77.0)
Platelets: 252 10*3/uL (ref 150.0–400.0)
RBC: 4 Mil/uL (ref 3.87–5.11)
RDW: 13.6 % (ref 11.5–15.5)
WBC: 3 10*3/uL — ABNORMAL LOW (ref 4.0–10.5)

## 2018-02-23 LAB — COMPREHENSIVE METABOLIC PANEL
ALT: 18 U/L (ref 0–35)
AST: 19 U/L (ref 0–37)
Albumin: 4.3 g/dL (ref 3.5–5.2)
Alkaline Phosphatase: 74 U/L (ref 39–117)
BUN: 9 mg/dL (ref 6–23)
CALCIUM: 9.3 mg/dL (ref 8.4–10.5)
CHLORIDE: 105 meq/L (ref 96–112)
CO2: 31 mEq/L (ref 19–32)
CREATININE: 0.64 mg/dL (ref 0.40–1.20)
GFR: 98.62 mL/min (ref >60.00)
Glucose, Bld: 88 mg/dL (ref 70–99)
Potassium: 4.4 mEq/L (ref 3.5–5.1)
SODIUM: 141 meq/L (ref 135–145)
TOTAL PROTEIN: 6.5 g/dL (ref 6.0–8.3)
Total Bilirubin: 0.6 mg/dL (ref 0.2–1.2)

## 2018-02-23 LAB — LIPID PANEL
CHOLESTEROL: 165 mg/dL (ref 0–200)
HDL: 79.1 mg/dL (ref >39.00)
LDL Cholesterol: 71 mg/dL (ref 0–99)
NonHDL: 86.2
TRIGLYCERIDES: 74 mg/dL (ref 0.0–149.0)
Total CHOL/HDL Ratio: 2
VLDL: 14.8 mg/dL (ref 0.0–40.0)

## 2018-02-23 LAB — TSH: TSH: 1.25 u[IU]/mL (ref 0.35–4.50)

## 2018-02-23 LAB — VITAMIN D 25 HYDROXY (VIT D DEFICIENCY, FRACTURES): VITD: 35.97 ng/mL (ref 30.00–100.00)

## 2018-02-23 MED ORDER — FLUOXETINE HCL 10 MG PO CAPS: 10.0000 mg | ORAL_CAPSULE | Freq: Every day | ORAL | 1 refills | Status: DC

## 2018-02-23 NOTE — Progress Notes (Signed)
Patient: Raven Gomez MRN: [id number] DOB: [birth year] PCP: Orma Flaming, MD     Subjective:  Chief Complaint  Patient presents with  . Establish Care    HPI: The patient is a 48 y.o. female who presents today for annual exam. She denies any changes to past medical history. There have been no recent hospitalizations. They are following a well balanced diet and exercise plan. Weight has been stable. She has complaints of chronic fatigue that has been going on for years. She was bit by 2 ticks in the summer and is not sure how long they were on her. She is wanting to get tested for lymes disease. She denies any blood in her stool, easy bruising, shortness of breath, night sweats. States all of her labs have been normal.   GAD: history of anxiety. Diagnosed around 2009 when she was going through a divorce. Currently on 23m of prozac. Isn't sure this controls her symptoms completely as she has times where her anxiety really gets the best of her and she feels like this happens more than not. She does not suffer from panic attacks. She also has ADD.   ADD: treated by Dr. ARachel Moulds May change care of her ADD over to here.   Immunization History  Administered Date(s) Administered  . DTaP 04/23/1969, 05/30/1969, 07/01/1969, 08/05/1969  . IPV 05/30/1969, 08/05/1969, 10/07/1969, 09/09/1970  . MMR 03/07/1970   Colonoscopy: age 41  Mammogram: 04/2017-normal  Pap smear: 04/2017-normal  Hiv: screen done.  Tdap: 2015  Review of Systems  Constitutional: Positive for fatigue. Negative for chills and fever.  HENT: Negative for dental problem, ear pain, hearing loss and trouble swallowing.   Eyes: Negative for visual disturbance.  Respiratory: Negative for cough, chest tightness and shortness of breath.   Cardiovascular: Negative for chest pain, palpitations and leg swelling.  Gastrointestinal: Negative for abdominal pain, blood in stool, diarrhea and nausea.  Endocrine: Negative for cold  intolerance, polydipsia, polyphagia and polyuria.  Genitourinary: Negative for dysuria and hematuria.  Musculoskeletal: Negative for arthralgias.  Skin: Negative for rash.  Neurological: Negative for dizziness and headaches.  Psychiatric/Behavioral: Negative for dysphoric mood and sleep disturbance. The patient is not nervous/anxious.     Allergies Patient has No Known Allergies.  Past Medical History Patient  has a past medical history of Allergy, History of chicken pox, Screening for HIV (human immunodeficiency virus), and UTI (urinary tract infection).  Surgical History Patient  has a past surgical history that includes Wisdom tooth extraction (2008); Cesarean section; and Cholecystectomy (N/A, 08/28/2017).  Family History Pateint's family history includes Arthritis in her mother; Breast cancer (age of onset: 689 in her mother; COPD in her father; Cancer in her father, maternal grandmother, and mother; Depression in her brother, maternal grandmother, and mother; Diabetes in her maternal grandmother; Heart attack in her father; Heart disease in her paternal grandfather; Hypertension in her father, mother, and paternal grandfather; Mental illness in her brother and maternal grandmother; Stroke in her maternal grandfather and mother.  Social History Patient  reports that she has never smoked. She has never used smokeless tobacco. She reports current alcohol use. She reports that she does not use drugs.    Objective: Vitals:   02/23/18 1056  BP: 118/72  Pulse: 82  Temp: 97.7 F (36.5 C)  TempSrc: Oral  SpO2: 100%  Weight: 124 lb 9.6 oz (56.5 kg)  Height: 4' 11"  (1.499 m)    Body mass index is 25.17 kg/m.  Physical Exam Vitals signs  reviewed.  Constitutional:      Appearance: Normal appearance. She is well-developed and normal weight.  HENT:     Right Ear: Tympanic membrane, ear canal and external ear normal.     Left Ear: Tympanic membrane, ear canal and external ear  normal.  Eyes:     Conjunctiva/sclera: Conjunctivae normal.     Pupils: Pupils are equal, round, and reactive to light.  Neck:     Musculoskeletal: Normal range of motion and neck supple.     Thyroid: No thyromegaly.  Cardiovascular:     Rate and Rhythm: Normal rate and regular rhythm.     Heart sounds: Normal heart sounds. No murmur.  Pulmonary:     Effort: Pulmonary effort is normal.     Breath sounds: Normal breath sounds.  Abdominal:     General: Bowel sounds are normal. There is no distension.     Palpations: Abdomen is soft.     Tenderness: There is no abdominal tenderness.  Lymphadenopathy:     Cervical: No cervical adenopathy.  Skin:    General: Skin is warm and dry.     Capillary Refill: Capillary refill takes less than 2 seconds.     Findings: No rash.  Neurological:     Mental Status: She is alert and oriented to person, place, and time.     Cranial Nerves: No cranial nerve deficit.     Coordination: Coordination normal.     Deep Tendon Reflexes: Reflexes normal.  Psychiatric:        Behavior: Behavior normal.         GAD 7 : Generalized Anxiety Score 02/23/2018  Nervous, Anxious, on Edge 1  Control/stop worrying 2  Worry too much - different things 1  Trouble relaxing 1  Restless 0  Easily annoyed or irritable 0  Afraid - awful might happen 0  Total GAD 7 Score 5  Anxiety Difficulty Somewhat difficult     Assessment/plan: 1. Annual physical exam utd on health maintenance. Requesting records. Continue healthy lifestyle and recommended more exercise. F/u in one year or as needed.  Patient counseling [x]    Nutrition: Stressed importance of moderation in sodium/caffeine intake, saturated fat and cholesterol, caloric balance, sufficient intake of fresh fruits, vegetables, fiber, calcium, iron, and 1 mg of folate supplement per day (for females capable of pregnancy).  [x]    Stressed the importance of regular exercise.   []    Substance Abuse: Discussed  cessation/primary prevention of tobacco, alcohol, or other drug use; driving or other dangerous activities under the influence; availability of treatment for abuse.   [x]    Injury prevention: Discussed safety belts, safety helmets, smoke detector, smoking near bedding or upholstery.   [x]    Sexuality: Discussed sexually transmitted diseases, partner selection, use of condoms, avoidance of unintended pregnancy  and contraceptive alternatives.  [x]    Dental health: Discussed importance of regular tooth brushing, flossing, and dental visits.  [x]    Health maintenance and immunizations reviewed. Please refer to Health maintenance section.    - CBC with Differential/Platelet - Comprehensive metabolic panel - Lipid panel - TSH  2. GAD (generalized anxiety disorder) Well controlled based off GAD7 score, but she feels like she is having increased anxiety through the week. We are going to increase her prozac to 61m and see how she does on this. Will see her back in 1-3 months if she f/u for her ADD. Discussed would still like her to f/u for anxiety [facility]   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 Is thinking about transferring here. Im fine taking over and discussed will need records and to see her every 3 months. She will let us know.   4. Other fatigue Checking labs and lymes WB. Did discuss starting exercise. Gets good sleep. Will f/u on labs and go from there. Seeing if increase in prozac helps as well.  - VITAMIN D 25 Hydroxy (Vit-D Deficiency, Fractures)  5. Chronic fatigue See above.  - B. burgdorfi antibodies by WB    Return in about 4 months (around 06/24/2018) for add f/u if want to come here. Orma Flaming, MD [place]  02/23/2018

## 2018-02-24 ENCOUNTER — Ambulatory Visit: Payer: 59 | Admitting: Family Medicine

## 2018-02-27 LAB — B. BURGDORFI ANTIBODIES BY WB
B BURGDORFERI IGG ABS (IB): NEGATIVE
B burgdorferi IgM Abs (IB): NEGATIVE
LYME DISEASE 18 KD IGG: NONREACTIVE
LYME DISEASE 23 KD IGG: NONREACTIVE
LYME DISEASE 23 KD IGM: NONREACTIVE
LYME DISEASE 28 KD IGG: NONREACTIVE
LYME DISEASE 39 KD IGM: NONREACTIVE
Lyme Disease 30 kD IgG: NONREACTIVE
Lyme Disease 39 kD IgG: REACTIVE — AB
Lyme Disease 41 kD IgG: NONREACTIVE
Lyme Disease 41 kD IgM: NONREACTIVE
Lyme Disease 45 kD IgG: NONREACTIVE
Lyme Disease 58 kD IgG: NONREACTIVE
Lyme Disease 66 kD IgG: NONREACTIVE
Lyme Disease 93 kD IgG: REACTIVE — AB

## 2018-04-05 ENCOUNTER — Encounter: Payer: Self-pay | Admitting: Family Medicine

## 2018-04-05 ENCOUNTER — Ambulatory Visit: Payer: BLUE CROSS/BLUE SHIELD | Admitting: Family Medicine

## 2018-04-05 VITALS — BP 124/80 | HR 82 | Temp 97.9°F | Ht 59.0 in | Wt 120.8 lb

## 2018-04-05 DIAGNOSIS — F988 Other specified behavioral and emotional disorders with onset usually occurring in childhood and adolescence: Secondary | ICD-10-CM

## 2018-04-05 DIAGNOSIS — F411 Generalized anxiety disorder: Secondary | ICD-10-CM | POA: Diagnosis not present

## 2018-04-05 MED ORDER — AMPHETAMINE-DEXTROAMPHET ER 15 MG PO CP24: 15.0000 mg | ORAL_CAPSULE | Freq: Every day | ORAL | 0 refills | Status: DC

## 2018-04-05 MED ORDER — FLUOXETINE HCL 20 MG PO TABS: 20.0000 mg | ORAL_TABLET | Freq: Every day | ORAL | 3 refills | Status: DC

## 2018-04-05 MED ORDER — AMPHETAMINE-DEXTROAMPHETAMINE 20 MG PO TABS: ORAL_TABLET | ORAL | 0 refills | Status: DC

## 2018-04-05 NOTE — Progress Notes (Signed)
Patient: Raven Gomez MRN: [id number] DOB: [birth year] PCP: Orma Flaming, MD     Subjective:  Chief Complaint  Patient presents with  . Medication Refill    HPI: The patient is a 48 y.o. female who presents today for anxiety follow up. We increased her prozac to 20mg  at her last visit. She can tell a difference and is overall really happy. She states the next 2 weeks will be really stressful as she is moving to a new salon. Her fatigue is about the same, but she is more motivated to do stuff.   Also is going to switch to me for her ADD medication. She is on adderall xr 15mg  daily and takes 20mg  IR prn. Was being seen at [facility]. No change in medication. Takes as prescribed. No side effects and tolerating well.   Review of Systems  Constitutional: Negative for fatigue.  Respiratory: Negative for shortness of breath.   Cardiovascular: Negative for chest pain.  Gastrointestinal: Negative for abdominal pain and nausea.  Neurological: Negative for dizziness and headaches.  Psychiatric/Behavioral: Negative for sleep disturbance. The patient is not nervous/anxious.     Allergies Patient has No Known Allergies.  Past Medical History Patient  has a past medical history of Allergy, History of chicken pox, Screening for HIV (human immunodeficiency virus), and UTI (urinary tract infection).  Surgical History Patient  has a past surgical history that includes Wisdom tooth extraction (2008); Cesarean section; and Cholecystectomy (N/A, 08/28/2017).  Family History Pateint's family history includes Arthritis in her mother; Breast cancer (age of onset: 30) in her mother; COPD in her father; Cancer in her father, maternal grandmother, and mother; Depression in her brother, maternal grandmother, and mother; Diabetes in her maternal grandmother; Heart attack in her father; Heart disease in her paternal grandfather; Hypertension in her father, mother, and paternal  grandfather; Mental illness in her brother and maternal grandmother; Stroke in her maternal grandfather and mother.  Social History Patient  reports that she has never smoked. She has never used smokeless tobacco. She reports current alcohol use. She reports that she does not use drugs.    Objective: Vitals:   04/05/18 0944  BP: 124/80  Pulse: 82  Temp: 97.9 F (36.6 C)  TempSrc: Oral  SpO2: 98%  Weight: 120 lb 12.8 oz (54.8 kg)  Height: 4\' 11"  (1.499 m)    Body mass index is 24.4 kg/m.  Physical Exam Vitals signs reviewed.  Constitutional:      Appearance: Normal appearance.  Cardiovascular:     Rate and Rhythm: Normal rate and regular rhythm.     Heart sounds: Normal heart sounds.  Pulmonary:     Effort: Pulmonary effort is normal.     Breath sounds: Normal breath sounds.  Abdominal:     General: Abdomen is flat. Bowel sounds are normal.     Palpations: Abdomen is soft.  Neurological:     General: No focal deficit present.     Mental Status: She is alert and oriented to person, place, and time.  Psychiatric:        Mood and Affect: Mood normal.        Behavior: Behavior normal.        GAD 7 : Generalized Anxiety Score 04/05/2018 02/23/2018  Nervous, Anxious, on Edge 1 1  Control/stop worrying 1 2  Worry too much - different things 1 1  Trouble relaxing 0 1  Restless 0 0  Easily annoyed or irritable 0 0  Afraid -  awful might happen 0 0  Total GAD 7 Score 3 5  Anxiety Difficulty Not difficult at all Somewhat difficult      Assessment/plan: 1. GAD (generalized anxiety disorder) GAD7 score significantly improved. She is doing much better. She may increase to 30mg  since she has some more stressful events coming up. im fine with this. Overall doing well on prozac. Continue this and will see her back for this in 6-12 months or as needed.   2. ADD pmp website reviewed and patient filling correctly. Drug contract filled out with [place] today. Refills given today. F/u  in 3 months for routine f/u.      Return in about 3 months (around 07/04/2018) for add med f/u .    Orma Flaming, MD [place]   04/05/2018

## 2018-05-13 ENCOUNTER — Other Ambulatory Visit: Payer: Self-pay | Admitting: Family Medicine

## 2018-05-15 ENCOUNTER — Other Ambulatory Visit: Payer: Self-pay

## 2018-05-15 ENCOUNTER — Other Ambulatory Visit: Payer: Self-pay | Admitting: Family Medicine

## 2018-05-15 MED ORDER — AMPHETAMINE-DEXTROAMPHETAMINE 20 MG PO TABS: ORAL_TABLET | ORAL | 0 refills | Status: DC

## 2018-05-15 MED ORDER — AMPHETAMINE-DEXTROAMPHET ER 15 MG PO CP24: 15.0000 mg | ORAL_CAPSULE | Freq: Every day | ORAL | 0 refills | Status: DC

## 2018-05-15 NOTE — Progress Notes (Signed)
pmp website reviewed. Filling correctly. F/u in may for doxy or in office visit. meds refilled.  Orma Flaming, MD [place]

## 2018-05-15 NOTE — Telephone Encounter (Signed)
Please Advise

## 2018-05-15 NOTE — Telephone Encounter (Signed)
Maddy, please schedule doxy appointment for refill, 0 left.

## 2018-07-05 ENCOUNTER — Encounter: Payer: Self-pay | Admitting: Family Medicine

## 2018-07-05 ENCOUNTER — Ambulatory Visit: Payer: BLUE CROSS/BLUE SHIELD | Admitting: Family Medicine

## 2018-07-05 ENCOUNTER — Other Ambulatory Visit: Payer: Self-pay

## 2018-07-05 VITALS — BP 112/82 | HR 88 | Temp 98.1°F | Ht 59.0 in | Wt 119.8 lb

## 2018-07-05 DIAGNOSIS — F988 Other specified behavioral and emotional disorders with onset usually occurring in childhood and adolescence: Secondary | ICD-10-CM | POA: Diagnosis not present

## 2018-07-05 MED ORDER — AMPHETAMINE-DEXTROAMPHETAMINE 20 MG PO TABS: ORAL_TABLET | ORAL | 0 refills | Status: DC

## 2018-07-05 NOTE — Progress Notes (Signed)
Patient: Raven Gomez MRN: [id number] DOB: 11-02-1970 PCP: Orma Flaming, MD     Subjective:  Chief Complaint  Patient presents with  . ADHD    HPI: The patient is a 48 y.o. female who presents today for ADD medication follow up.   She is currently on adderall 15mg  XR daily and then 20mg  IR that she takes 1/2 tab in evening. She will also sometimes not take the XR and just the IR, BID depending on what she is doing. She is very happy with the medication. She denies any adverse effects. Takes medication as prescribed. Denies any N/V/D. No headaches and no issues sleeping.   We also increased her prozac to 30mg , but she could not tell a difference and went back down to 20mg . Feels like her anxiety is still very well controlled.   Review of Systems  Constitutional: Positive for diaphoresis. Negative for fatigue.  Respiratory: Negative for shortness of breath.   Cardiovascular: Negative for chest pain and palpitations.  Gastrointestinal: Negative for abdominal pain, diarrhea, nausea and vomiting.  Neurological: Negative for dizziness and headaches.  Psychiatric/Behavioral: Negative for agitation, behavioral problems, confusion, decreased concentration, sleep disturbance and suicidal ideas. The patient is not nervous/anxious.     Allergies Patient has No Known Allergies.  Past Medical History Patient  has a past medical history of Allergy, History of chicken pox, Screening for HIV (human immunodeficiency virus), and UTI (urinary tract infection).  Surgical History Patient  has a past surgical history that includes Wisdom tooth extraction (2008); Cesarean section; and Cholecystectomy (N/A, 08/28/2017).  Family History Pateint's family history includes Arthritis in her mother; Breast cancer (age of onset: 68) in her mother; COPD in her father; Cancer in her father, maternal grandmother, and mother; Depression in her brother, maternal grandmother, and mother; Diabetes in her maternal  grandmother; Heart attack in her father; Heart disease in her paternal grandfather; Hypertension in her father, mother, and paternal grandfather; Mental illness in her brother and maternal grandmother; Stroke in her maternal grandfather and mother.  Social History Patient  reports that she has never smoked. She has never used smokeless tobacco. She reports current alcohol use. She reports that she does not use drugs.    Objective: Vitals:   07/05/18 0929  BP: 112/82  Pulse: 88  Temp: 98.1 F (36.7 C)  TempSrc: Oral  SpO2: 98%  Weight: 119 lb 12.8 oz (54.3 kg)  Height: 4\' 11"  (1.499 m)    Body mass index is 24.2 kg/m.  Physical Exam Vitals signs reviewed.  Constitutional:      Appearance: Normal appearance.  Neck:     Musculoskeletal: Normal range of motion and neck supple.  Cardiovascular:     Rate and Rhythm: Normal rate and regular rhythm.     Heart sounds: Normal heart sounds.  Pulmonary:     Effort: Pulmonary effort is normal.     Breath sounds: Normal breath sounds.  Abdominal:     General: Abdomen is flat. Bowel sounds are normal.  Skin:    General: Skin is warm and dry.     Capillary Refill: Capillary refill takes less than 2 seconds.  Neurological:     General: No focal deficit present.     Mental Status: She is alert and oriented to person, place, and time.  Psychiatric:        Mood and Affect: Mood normal.        Behavior: Behavior normal.        Assessment/plan: 1. Attention  deficit disorder (ADD) without hyperactivity PMP website reviewed and patient taking/fillingcorrectly. Does not need refill of the XR. Just filled on 06/17/18. Refilled her IR. She can f/u in 4-5 months when back from maternity leave.   -anxiety well controlled. Continue prozac 20mg . No refills needed.   Return in about 4 months (around 11/05/2018).   Orma Flaming, MD [place]   07/05/2018

## 2018-08-03 ENCOUNTER — Other Ambulatory Visit: Payer: Self-pay | Admitting: Family Medicine

## 2018-08-03 MED ORDER — AMPHETAMINE-DEXTROAMPHET ER 15 MG PO CP24: 15.0000 mg | ORAL_CAPSULE | Freq: Every day | ORAL | 0 refills | Status: DC

## 2018-08-03 NOTE — Telephone Encounter (Signed)
Requesting refill on Adderall XR 15mg  Last OV 07/05/2018, Last refill 4/6 # 30, refill 0. Dr. Shelby Mattocks pt.

## 2018-09-20 ENCOUNTER — Encounter: Payer: Self-pay | Admitting: Family Medicine

## 2018-09-20 ENCOUNTER — Ambulatory Visit (INDEPENDENT_AMBULATORY_CARE_PROVIDER_SITE_OTHER): Payer: BC Managed Care – PPO | Admitting: Family Medicine

## 2018-09-20 ENCOUNTER — Ambulatory Visit: Payer: Self-pay | Admitting: Family Medicine

## 2018-09-20 VITALS — Temp 97.7°F | Ht 59.0 in | Wt 121.0 lb

## 2018-09-20 DIAGNOSIS — L309 Dermatitis, unspecified: Secondary | ICD-10-CM

## 2018-09-20 DIAGNOSIS — R42 Dizziness and giddiness: Secondary | ICD-10-CM | POA: Diagnosis not present

## 2018-09-20 MED ORDER — TRIAMCINOLONE ACETONIDE 0.5 % EX OINT: 1.0000 "application " | TOPICAL_OINTMENT | Freq: Two times a day (BID) | CUTANEOUS | 1 refills | Status: DC

## 2018-09-20 MED ORDER — MECLIZINE HCL 25 MG PO TABS: 25.0000 mg | ORAL_TABLET | Freq: Three times a day (TID) | ORAL | 0 refills | Status: DC | PRN

## 2018-09-20 NOTE — Progress Notes (Signed)
Patient: Raven Gomez MRN: [id number] DOB: 03-13-1970 PCP: Orma Flaming, MD     Subjective: I connected with  Raven Gomez on 09/20/18 by a video enabled telemedicine application and verified that I am speaking with the correct person using two identifiers.   I discussed the limitations of evaluation and management by telemedicine. The patient expressed understanding and agreed to proceed.   Chief Complaint  Patient presents with  . poss shingles  . Dizziness    HPI: The patient is a 48 y.o. female who presents today for rash and dizziness.  Rash started about 10 days ago. She had an itchy spot the size of a dime then it went away, but then started to get them in odd places. These places cleared up except around her breast area. They are itchy and tender. They are not raised and look under skin. Get almost a purple color in nature. Were raised and scaly at first. No new detergent or new bras.  Has used benadryl and hydrocortisone cream with no benefit. Lavender cream has been the only thing that helps.   She also has been dizzy. She is dizzy all of the time. It started on Sunday night. When she woke up Monday morning she was dizzy and her eyes wouldn't focus. Dizziness got worse throughout the day. She also had some nausea associated with it. She took 2 dramamine and it helped with the nausea. She wasn't able to work yesterday due to the dizziness. Dizziness is not worse with head movement. She has been doing vertigo exercises. This seems to help on the left side. She feels like she is spinning and the world is standing still. No hearing loss and no ringing in the ears. She does stumble and feels like she can't focus. No headaches or double vision. She has never had vertigo before. No recent air travel or sinus infection. No trauma.   Review of Systems  Constitutional: Positive for fatigue. Negative for chills and fever.  HENT: Negative.   Eyes: Positive for visual disturbance.   Respiratory: Negative for cough and shortness of breath.   Cardiovascular: Negative.   Gastrointestinal: Positive for nausea. Negative for abdominal pain, diarrhea and vomiting.  Endocrine: Negative for polydipsia and polyuria.  Genitourinary: Negative.   Musculoskeletal: Negative for back pain, myalgias and neck pain.  Skin: Positive for rash.       Rash on her breasts, very itchy, tender to touch Rash also on R elbow  Neurological: Positive for dizziness. Negative for headaches.  Psychiatric/Behavioral: Negative for sleep disturbance.    Allergies Patient has No Known Allergies.  Past Medical History Patient  has a past medical history of Allergy, History of chicken pox, Screening for HIV (human immunodeficiency virus), and UTI (urinary tract infection).  Surgical History Patient  has a past surgical history that includes Wisdom tooth extraction (2008); Cesarean section; and Cholecystectomy (N/A, 08/28/2017).  Family History Pateint's family history includes Arthritis in her mother; Breast cancer (age of onset: 10) in her mother; COPD in her father; Cancer in her father, maternal grandmother, and mother; Depression in her brother, maternal grandmother, and mother; Diabetes in her maternal grandmother; Heart attack in her father; Heart disease in her paternal grandfather; Hypertension in her father, mother, and paternal grandfather; Mental illness in her brother and maternal grandmother; Stroke in her maternal grandfather and mother.  Social History Patient  reports that she has never smoked. She has never used smokeless tobacco. She reports current alcohol use. She reports  that she does not use drugs.    Objective: Vitals:   09/20/18 1454  Temp: 97.7 F (36.5 C)  TempSrc: Oral  Weight: 121 lb (54.9 kg)  Height: 4\' 11"  (1.499 m)    Body mass index is 24.44 kg/m.  Physical Exam Vitals signs reviewed.  Constitutional:      Appearance: Normal appearance. She is normal  weight.  HENT:     Head: Normocephalic and atraumatic.  Eyes:     Extraocular Movements: Extraocular movements intact.  Skin:    Findings: Rash (purple macular rash under left breast) present.  Neurological:     General: No focal deficit present.     Mental Status: She is alert and oriented to person, place, and time.     Comments: Some dizziness with turning head quickly to the right        Assessment/plan: 1. Dermatitis Very hard to see rash on video. Will do trial of triamcinolone cream bid and if not better will need her to come into office.   2. Dizziness Can not examine so will do trial of treatment for vertigo. Continue exercises TID and will do meclizine prn. If not better in 2 weeks will image head.    Return in about 2 weeks (around 10/04/2018) for if no improvement .   Orma Flaming, MD [place]   09/20/2018

## 2018-09-20 NOTE — Telephone Encounter (Signed)
lvm for patient to call back and schedule appt.

## 2018-09-20 NOTE — Telephone Encounter (Signed)
Patient was scheduled for doxy visit 8/12 @ 2:40p

## 2018-09-20 NOTE — Telephone Encounter (Signed)
Broke out in a rash 1 week ago. Mild pain, some itching. Similar to shingles she has had before. Also has vertigo as well. Worse with movement, and it is getting worse today.Requests a visit. Unable to reach practice while pt. On the phone. Please advise pt.  Answer Assessment - Initial Assessment Questions 1. APPEARANCE of RASH: "Describe the rash."      Red, raised 2. LOCATION: "Where is the rash located?"      Left breast, going around 3. ONSET: "When did the rash start?"      Started 1 week ago 4. ITCHING: "Does the rash itch?" If so, ask: "How bad is the itch?"  (Scale 1-10; or mild, moderate, severe)     Moderate 5. PAIN: "Does the rash hurt?" If so, ask: "How bad is the pain?"  (Scale 1-10; or mild, moderate, severe)     Tender to touch 6. OTHER SYMPTOMS: "Do you have any other symptoms?" (e.g., fever)     No 7. PREGNANCY: "Is there any chance you are pregnant?" "When was your last menstrual period?"     No  Protocols used: [facility]

## 2018-09-20 NOTE — Telephone Encounter (Signed)
Please call pt to schedule appt w/ Dr. Rogers Blocker or whichever provider has most immediate availability.  FYI for [facility]

## 2018-10-11 ENCOUNTER — Other Ambulatory Visit: Payer: Self-pay | Admitting: Family Medicine

## 2018-10-11 ENCOUNTER — Other Ambulatory Visit: Payer: Self-pay

## 2018-10-12 MED ORDER — AMPHETAMINE-DEXTROAMPHETAMINE 20 MG PO TABS: ORAL_TABLET | ORAL | 0 refills | Status: DC

## 2018-10-12 MED ORDER — AMPHETAMINE-DEXTROAMPHET ER 15 MG PO CP24: 15.0000 mg | ORAL_CAPSULE | Freq: Every day | ORAL | 0 refills | Status: DC

## 2018-10-12 NOTE — Progress Notes (Signed)
Checked pmp. Filling correctly but previous physician filled in may. Has appointment this month.

## 2018-10-16 IMAGING — US US ABDOMEN LIMITED
1 series · 14 of 25 positions shown · non-contrast
Comparison: None.

CLINICAL DATA: Right upper quadrant pain, nausea

EXAM:
ULTRASOUND ABDOMEN LIMITED RIGHT UPPER QUADRANT

[Series 1: us abdomen limited · 0.11mm/px · 14 of 50 slices shown]
[im 1/50]
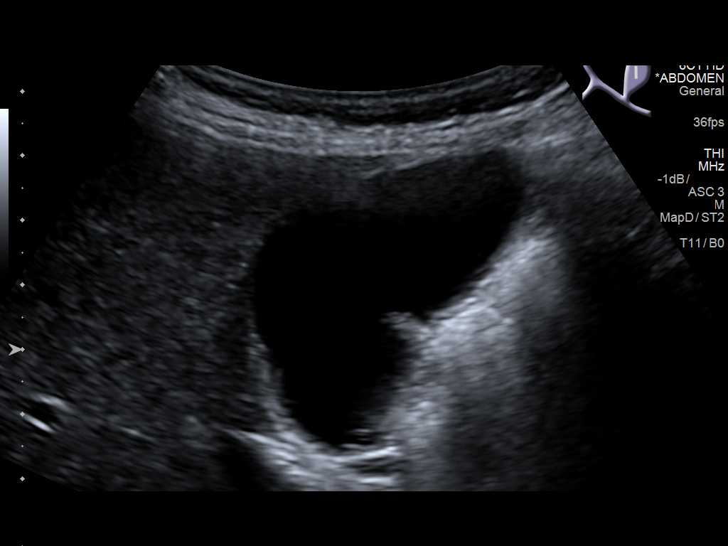
[im 5/50]
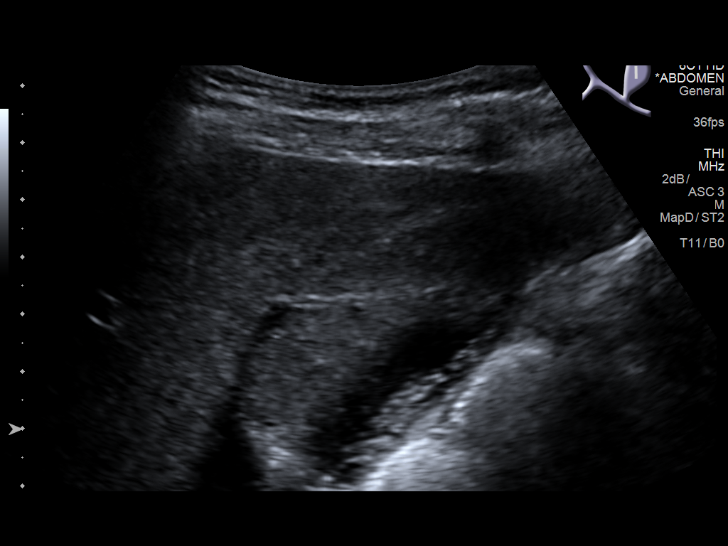
[im 9/50]
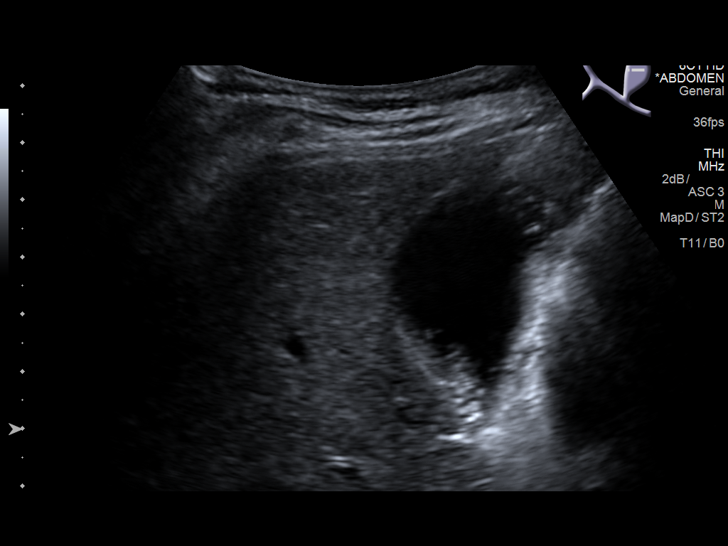
[im 13/50]
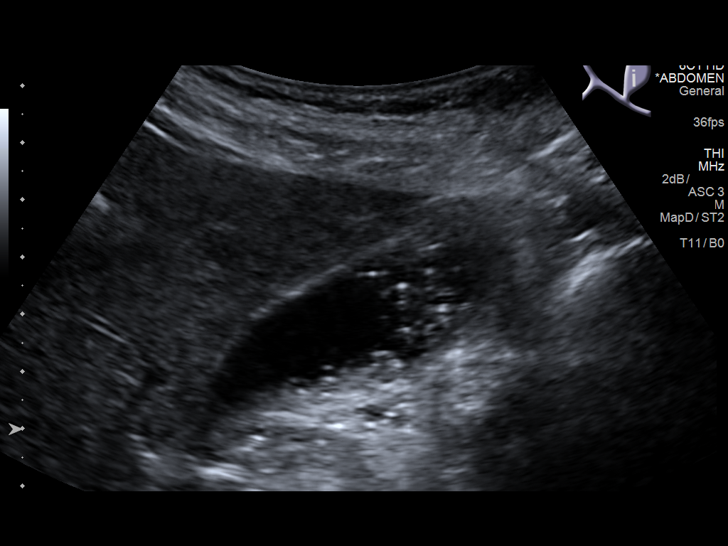
[im 17/50]
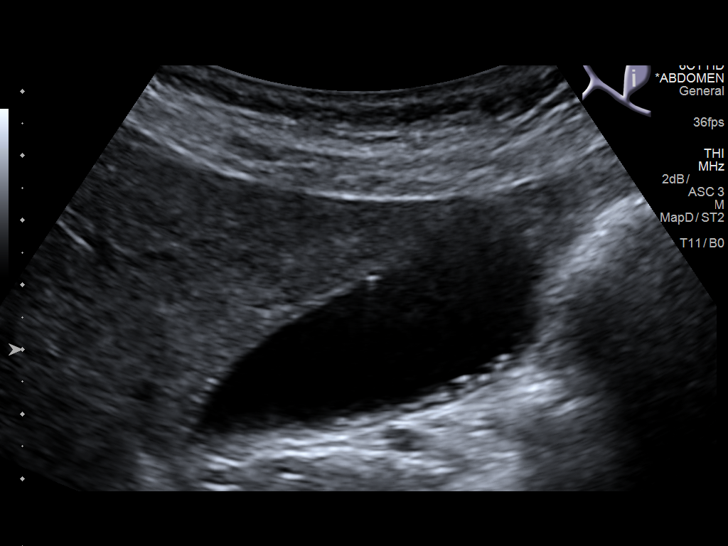
[im 19/50]
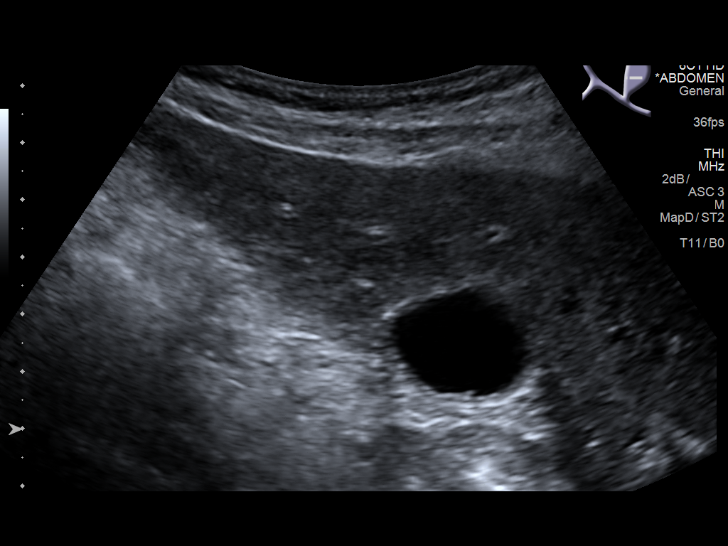
[im 23/50]
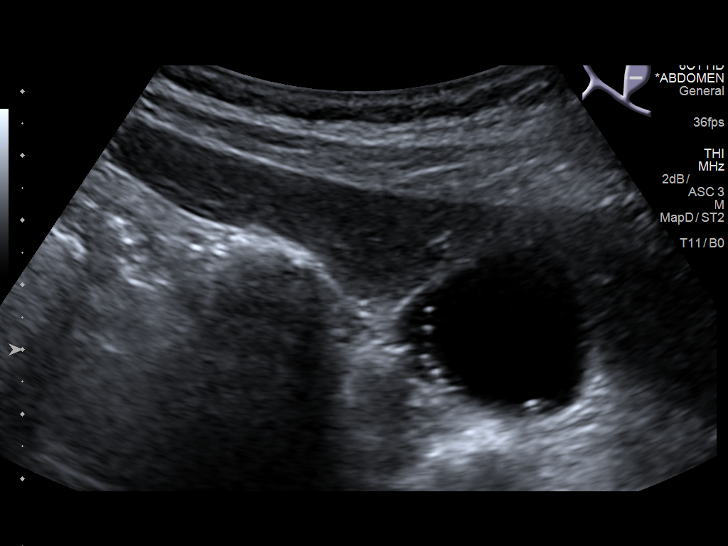
[im 27/50]
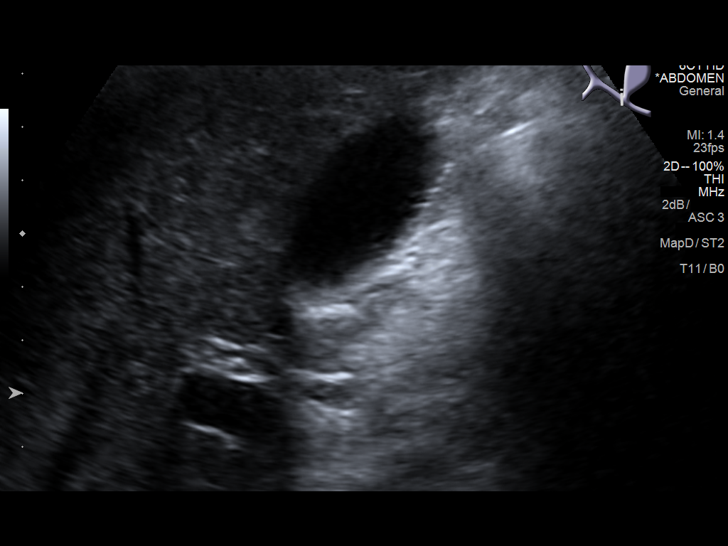
[im 31/50]
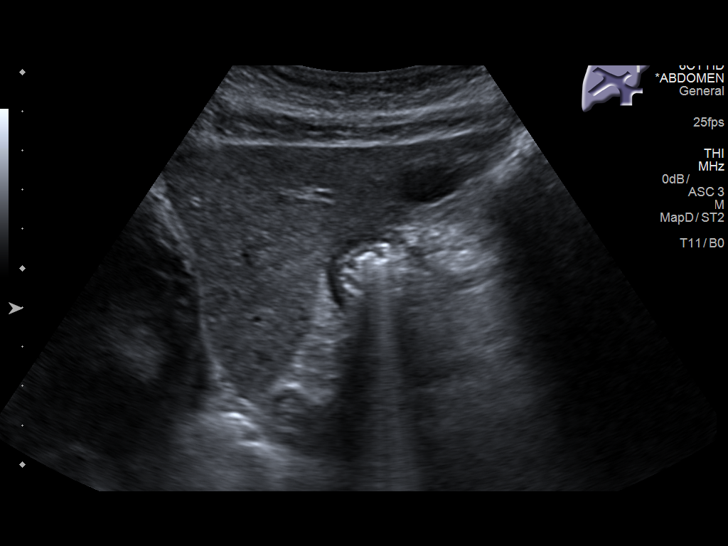
[im 33/50]
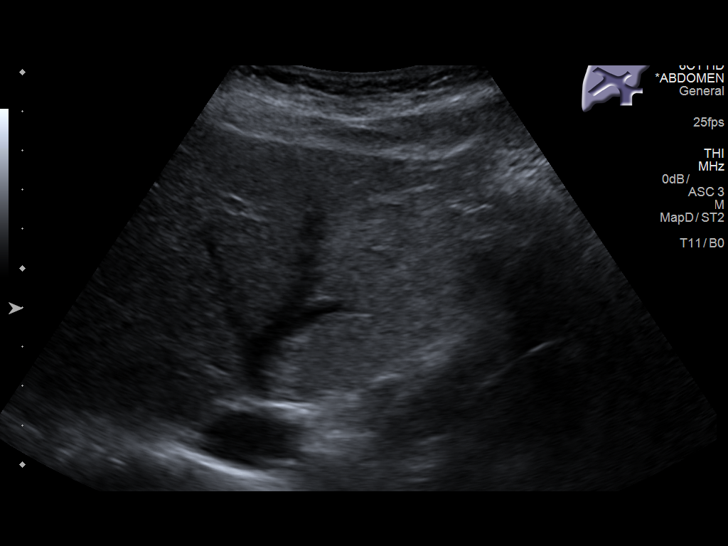
[im 37/50]
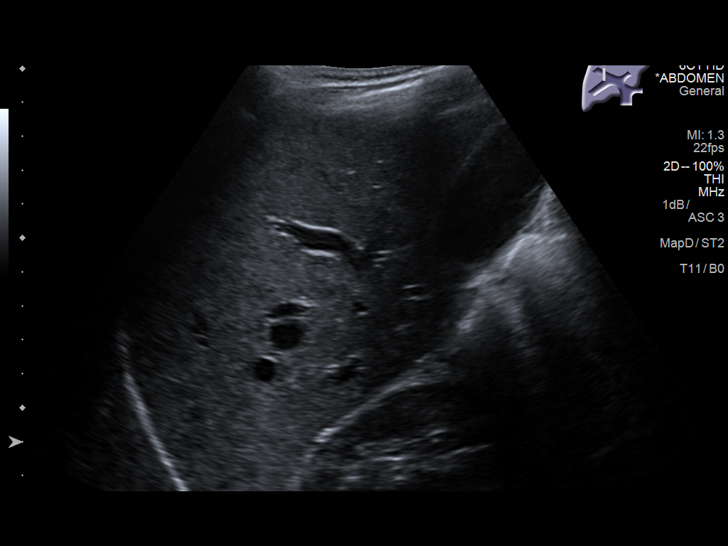
[im 41/50]
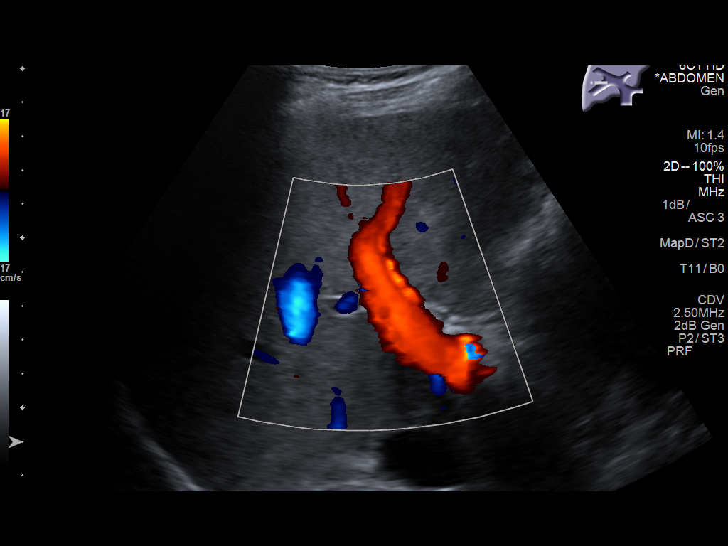
[im 45/50]
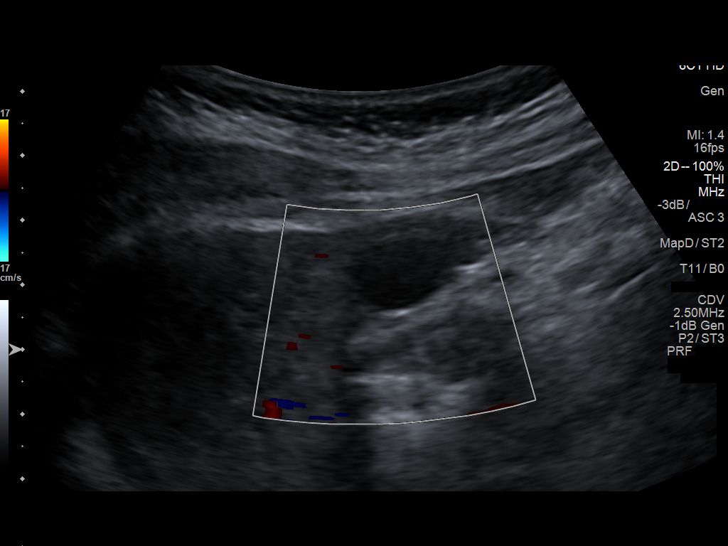
[im 50/50]
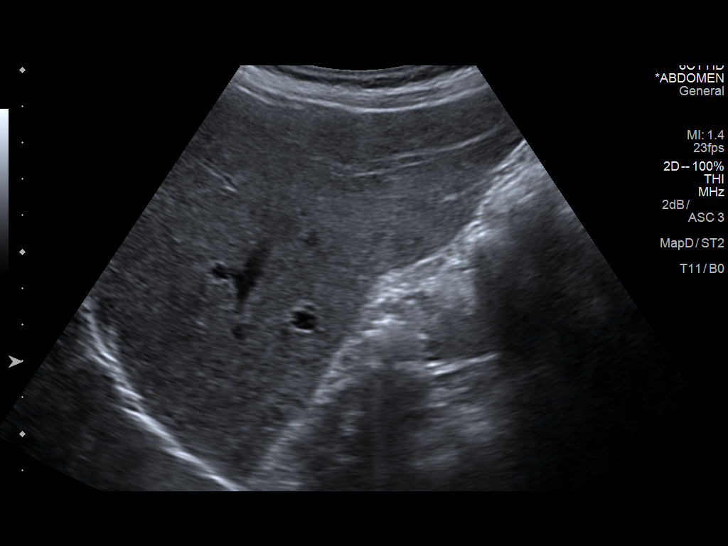

[14 of 25 positions shown; findings below may reference images not displayed]

FINDINGS: Gallbladder:

No gallbladder wall thickening or pericholecystic fluid. Multiple
layering small non-shadowing calculi measuring up to 3 mm.
Sonographer reports no sonographic Murphy's sign.

Common bile duct:

Diameter: 2 mm, unremarkable

Liver:

1.5 cm probable benign cyst in the left lobe. No other focal lesion
identified. Within normal limits in parenchymal echogenicity. Portal
vein is patent on color Doppler imaging with normal direction of
blood flow towards the liver.
IMPRESSION: 1. Cholelithiasis without other ultrasound evidence of cholecystitis
or biliary obstruction.
2. Probable benign left hepatic cyst 1.5 cm.

## 2018-11-06 ENCOUNTER — Encounter: Payer: Self-pay | Admitting: Family Medicine

## 2018-11-06 ENCOUNTER — Other Ambulatory Visit: Payer: Self-pay

## 2018-11-06 ENCOUNTER — Ambulatory Visit: Payer: BC Managed Care – PPO | Admitting: Family Medicine

## 2018-11-06 VITALS — BP 116/65 | HR 73 | Temp 98.1°F | Ht 59.0 in | Wt 121.8 lb

## 2018-11-06 DIAGNOSIS — L309 Dermatitis, unspecified: Secondary | ICD-10-CM

## 2018-11-06 DIAGNOSIS — Z9109 Other allergy status, other than to drugs and biological substances: Secondary | ICD-10-CM | POA: Diagnosis not present

## 2018-11-06 DIAGNOSIS — F988 Other specified behavioral and emotional disorders with onset usually occurring in childhood and adolescence: Secondary | ICD-10-CM

## 2018-11-06 MED ORDER — AMPHETAMINE-DEXTROAMPHET ER 15 MG PO CP24: 15.0000 mg | ORAL_CAPSULE | Freq: Every day | ORAL | 0 refills | Status: DC

## 2018-11-06 NOTE — Patient Instructions (Signed)
Try regular zyrtec twice a day until rash goes away then daily to help allergies/sinuses  Start flonase twice a day. If sinuses not better, let me know.   Rash: I think it's something in your mom's yard. Do the zyrtec, use steroid cream and benadryl at night. If not better we need to do oral steroids.    You look great!

## 2018-11-06 NOTE — Progress Notes (Signed)
Patient: Raven Gomez MRN: [id number] DOB: [birth year] PCP: Orma Flaming, MD     Subjective:  Chief Complaint  Patient presents with  . Medication Refill    HPI: The patient is a 48 y.o. female who presents today for ADHD med refill. She is filling appropriately. currently on  Adderall Xr 15mg /day and 1/2 of an IR 20mg  prn. She is doing well. She doesn't mind breaking the 20mg  in half and doesn't run out very fast.   Allergies/sinus pressure: She is having sinus pressure in her nose and a dry cough at times. She also has had some headaches with the rainy weather. Has a dull headache now. Not using any allergy medication.   Rash: she has the same rash as last time. She worked in her moms yard then got a rash. It went away and didn't really use steroid cream I gave as prescribed. She worked in her mom's yard again yesterday and has another rash that is the same as last time. Something in her mother's yard is giving her a dermatitis. The only thing that helped is lavendar oil. She never took benadryl or zytec.   Review of Systems  Constitutional: Negative for fatigue.  HENT: Positive for sinus pain. Negative for congestion, postnasal drip, sinus pressure and sore throat.   Eyes: Negative for visual disturbance.  Respiratory: Negative for cough and shortness of breath.   Cardiovascular: Negative for chest pain, palpitations and leg swelling.  Gastrointestinal: Negative for abdominal pain, diarrhea, nausea and vomiting.  Musculoskeletal: Negative for arthralgias, back pain and neck pain.  Skin: Positive for rash.  Neurological: Positive for headaches. Negative for dizziness.  Psychiatric/Behavioral: Negative for sleep disturbance.    Allergies Patient has No Known Allergies.  Past Medical History Patient  has a past medical history of Allergy, History of chicken pox, Screening for HIV (human immunodeficiency virus), and UTI (urinary tract infection).  Surgical History Patient   has a past surgical history that includes Wisdom tooth extraction (2008); Cesarean section; and Cholecystectomy (N/A, 08/28/2017).  Family History Pateint's family history includes Arthritis in her mother; Breast cancer (age of onset: 5) in her mother; COPD in her father; Cancer in her father, maternal grandmother, and mother; Depression in her brother, maternal grandmother, and mother; Diabetes in her maternal grandmother; Heart attack in her father; Heart disease in her paternal grandfather; Hypertension in her father, mother, and paternal grandfather; Mental illness in her brother and maternal grandmother; Stroke in her maternal grandfather and mother.  Social History Patient  reports that she has never smoked. She has never used smokeless tobacco. She reports current alcohol use. She reports that she does not use drugs.    Objective: Vitals:   11/06/18 1053  BP: 116/65  Pulse: 73  Temp: 98.1 F (36.7 C)  TempSrc: Skin  SpO2: 99%  Weight: 121 lb 12.8 oz (55.2 kg)  Height: 4\' 11"  (1.499 m)    Body mass index is 24.6 kg/m.  Physical Exam Vitals signs reviewed.  Constitutional:      Appearance: Normal appearance. She is normal weight.  HENT:     Head: Normocephalic and atraumatic.     Nose: Nose normal.     Comments: ttp over her maxillary and frontal sinuses    Mouth/Throat:     Mouth: Mucous membranes are moist.  Eyes:     Extraocular Movements: Extraocular movements intact.     Conjunctiva/sclera: Conjunctivae normal.     Pupils: Pupils are equal, round, and reactive to light.  Neck:     Musculoskeletal: Normal range of motion and neck supple.  Cardiovascular:     Rate and Rhythm: Normal rate and regular rhythm.     Pulses: Normal pulses.  Pulmonary:     Effort: Pulmonary effort is normal.     Breath sounds: Normal breath sounds.  Abdominal:     General: Abdomen is flat. Bowel sounds are normal.     Palpations: Abdomen is soft.  Skin:    Findings: Rash  (erythematous macular papular rash on her neck and right antecubital fossa ) present.  Neurological:     General: No focal deficit present.     Mental Status: She is alert and oriented to person, place, and time.  Psychiatric:        Mood and Affect: Mood normal.        Behavior: Behavior normal.    Depression screen [facility] 2/9 11/06/2018  Decreased Interest 0  Down, Depressed, Hopeless 0  PHQ - 2 Score 0       Assessment/plan: 1. Attention deficit disorder (ADD) without hyperactivity PMP website checked and she if filling appropriately. Refills given today. F/u in 3 months.   2. Dermatitis Secondary to something in her mom's yard. Recommended she wear long sleeves and wash in hot water when done. Re start steroid cream and continue to use BID for 7 days. Zyrtec BID then back down to daily after cleared. Let me know if not getting better.   3. Environmental allergies Start flonase and zyrtec daily. No indication for antibiotics at this point. Let me know if sinus issue doesn't feel like clearing up with conservative measures.   Return in about 4 months (around 03/08/2019) for annual/fasting labs and adhd f/u .   Orma Flaming, MD [place]     11/06/2018

## 2018-11-08 ENCOUNTER — Other Ambulatory Visit: Payer: Self-pay | Admitting: Family Medicine

## 2018-11-08 MED ORDER — PREDNISONE 20 MG PO TABS
ORAL_TABLET | ORAL | 0 refills | Status: DC
Start: 2018-11-08 — End: 2018-12-06

## 2018-11-16 ENCOUNTER — Encounter: Payer: Self-pay | Admitting: Family Medicine

## 2018-11-16 DIAGNOSIS — Z1231 Encounter for screening mammogram for malignant neoplasm of breast: Secondary | ICD-10-CM | POA: Diagnosis not present

## 2018-11-16 DIAGNOSIS — Z01419 Encounter for gynecological examination (general) (routine) without abnormal findings: Secondary | ICD-10-CM | POA: Diagnosis not present

## 2018-11-16 DIAGNOSIS — Z6824 Body mass index (BMI) 24.0-24.9, adult: Secondary | ICD-10-CM | POA: Diagnosis not present

## 2018-11-16 DIAGNOSIS — N898 Other specified noninflammatory disorders of vagina: Secondary | ICD-10-CM | POA: Diagnosis not present

## 2018-11-16 DIAGNOSIS — Z1151 Encounter for screening for human papillomavirus (HPV): Secondary | ICD-10-CM | POA: Diagnosis not present

## 2018-11-20 DIAGNOSIS — M79674 Pain in right toe(s): Secondary | ICD-10-CM | POA: Diagnosis not present

## 2018-12-06 ENCOUNTER — Other Ambulatory Visit: Payer: Self-pay | Admitting: Orthopaedic Surgery

## 2018-12-06 ENCOUNTER — Encounter (HOSPITAL_BASED_OUTPATIENT_CLINIC_OR_DEPARTMENT_OTHER): Payer: Self-pay | Admitting: *Deleted

## 2018-12-06 ENCOUNTER — Other Ambulatory Visit: Payer: Self-pay

## 2018-12-06 DIAGNOSIS — M79674 Pain in right toe(s): Secondary | ICD-10-CM | POA: Diagnosis not present

## 2018-12-07 ENCOUNTER — Other Ambulatory Visit (HOSPITAL_COMMUNITY)
Admission: RE | Admit: 2018-12-07 | Discharge: 2018-12-07 | Disposition: A | Discharge status: Home / Self Care | Payer: BC Managed Care – PPO | Source: Ambulatory Visit | Attending: Orthopaedic Surgery | Admitting: Orthopaedic Surgery

## 2018-12-07 NOTE — Progress Notes (Signed)
Attempted to call patient to inform that [place] covid site has closed due to inclement weather. Left voicemail to call back to reschedule for 10/30 or 10/31.

## 2018-12-08 ENCOUNTER — Other Ambulatory Visit (HOSPITAL_COMMUNITY)
Admission: RE | Admit: 2018-12-08 | Discharge: 2018-12-08 | Disposition: A | Discharge status: Home / Self Care | Payer: BC Managed Care – PPO | Source: Ambulatory Visit | Attending: Orthopaedic Surgery | Admitting: Orthopaedic Surgery

## 2018-12-08 DIAGNOSIS — Z20828 Contact with and (suspected) exposure to other viral communicable diseases: Secondary | ICD-10-CM | POA: Insufficient documentation

## 2018-12-08 DIAGNOSIS — Z01812 Encounter for preprocedural laboratory examination: Secondary | ICD-10-CM | POA: Diagnosis not present

## 2018-12-08 LAB — SARS CORONAVIRUS 2 (TAT 6-24 HRS): SARS Coronavirus 2: NEGATIVE

## 2018-12-11 ENCOUNTER — Ambulatory Visit (HOSPITAL_BASED_OUTPATIENT_CLINIC_OR_DEPARTMENT_OTHER)
Admission: RE | Admit: 2018-12-11 | Discharge: 2018-12-11 | Disposition: A | Discharge status: Home / Self Care | Payer: BC Managed Care – PPO | Attending: Orthopaedic Surgery | Admitting: Orthopaedic Surgery

## 2018-12-11 ENCOUNTER — Ambulatory Visit (HOSPITAL_BASED_OUTPATIENT_CLINIC_OR_DEPARTMENT_OTHER): Payer: BC Managed Care – PPO | Admitting: Certified Registered"

## 2018-12-11 ENCOUNTER — Other Ambulatory Visit: Payer: Self-pay

## 2018-12-11 ENCOUNTER — Encounter (HOSPITAL_BASED_OUTPATIENT_CLINIC_OR_DEPARTMENT_OTHER): Payer: Self-pay | Admitting: *Deleted

## 2018-12-11 ENCOUNTER — Encounter (HOSPITAL_BASED_OUTPATIENT_CLINIC_OR_DEPARTMENT_OTHER)
Admission: RE | Disposition: A | Discharge status: Home / Self Care | Payer: Self-pay | Source: Home / Self Care | Attending: Orthopaedic Surgery

## 2018-12-11 DIAGNOSIS — F909 Attention-deficit hyperactivity disorder, unspecified type: Secondary | ICD-10-CM | POA: Insufficient documentation

## 2018-12-11 DIAGNOSIS — X58XXXA Exposure to other specified factors, initial encounter: Secondary | ICD-10-CM | POA: Insufficient documentation

## 2018-12-11 DIAGNOSIS — S93114A Dislocation of interphalangeal joint of right lesser toe(s), initial encounter: Secondary | ICD-10-CM | POA: Diagnosis not present

## 2018-12-11 DIAGNOSIS — S96891A Other specified injury of other specified muscles and tendons at ankle and foot level, right foot, initial encounter: Secondary | ICD-10-CM | POA: Diagnosis not present

## 2018-12-11 DIAGNOSIS — S93139A Subluxation of interphalangeal joint of unspecified toe(s), initial encounter: Secondary | ICD-10-CM | POA: Diagnosis not present

## 2018-12-11 DIAGNOSIS — F411 Generalized anxiety disorder: Secondary | ICD-10-CM | POA: Diagnosis not present

## 2018-12-11 DIAGNOSIS — S96191A Other specified injury of muscle and tendon of long extensor muscle of toe at ankle and foot level, right foot, initial encounter: Secondary | ICD-10-CM | POA: Insufficient documentation

## 2018-12-11 DIAGNOSIS — Z79899 Other long term (current) drug therapy: Secondary | ICD-10-CM | POA: Diagnosis not present

## 2018-12-11 DIAGNOSIS — S92911A Unspecified fracture of right toe(s), initial encounter for closed fracture: Secondary | ICD-10-CM | POA: Insufficient documentation

## 2018-12-11 DIAGNOSIS — S93134A Subluxation of interphalangeal joint of right lesser toe(s), initial encounter: Secondary | ICD-10-CM | POA: Diagnosis not present

## 2018-12-11 DIAGNOSIS — N390 Urinary tract infection, site not specified: Secondary | ICD-10-CM | POA: Diagnosis not present

## 2018-12-11 HISTORY — PX: ORIF TOE FRACTURE: SHX5032

## 2018-12-11 HISTORY — DX: Attention-deficit hyperactivity disorder, unspecified type: F90.9

## 2018-12-11 LAB — POCT PREGNANCY, URINE: Preg Test, Ur: NEGATIVE

## 2018-12-11 SURGERY — OPEN REDUCTION INTERNAL FIXATION (ORIF) METATARSAL (TOE) FRACTURE
Anesthesia: General | Site: Toe | Laterality: Right

## 2018-12-11 MED ORDER — METOCLOPRAMIDE HCL 5 MG/ML IJ SOLN: 10.0000 mg | Freq: Once | INTRAMUSCULAR | Status: DC | PRN

## 2018-12-11 MED ORDER — POVIDONE-IODINE 10 % EX SWAB: 2.0000 "application " | Freq: Once | CUTANEOUS | Status: DC

## 2018-12-11 MED ORDER — CEFAZOLIN SODIUM-DEXTROSE 2-4 GM/100ML-% IV SOLN
INTRAVENOUS | Status: AC
  Filled 2018-12-11: qty 100

## 2018-12-11 MED ORDER — ONDANSETRON HCL 4 MG/2ML IJ SOLN
INTRAMUSCULAR | Status: DC | PRN
  Administered 2018-12-11: 4 mg via INTRAVENOUS

## 2018-12-11 MED ORDER — MIDAZOLAM HCL 2 MG/2ML IJ SOLN
INTRAMUSCULAR | Status: AC
  Filled 2018-12-11: qty 2

## 2018-12-11 MED ORDER — MIDAZOLAM HCL 2 MG/2ML IJ SOLN
1.0000 mg | INTRAMUSCULAR | Status: DC | PRN
  Administered 2018-12-11: 2 mg via INTRAVENOUS

## 2018-12-11 MED ORDER — HYDROCODONE-ACETAMINOPHEN 5-325 MG PO TABS: 1.0000 | ORAL_TABLET | ORAL | 0 refills | Status: AC | PRN

## 2018-12-11 MED ORDER — LACTATED RINGERS IV SOLN
INTRAVENOUS | Status: DC
  Administered 2018-12-11: 11:00:00 via INTRAVENOUS

## 2018-12-11 MED ORDER — FENTANYL CITRATE (PF) 100 MCG/2ML IJ SOLN: 25.0000 ug | INTRAMUSCULAR | Status: DC | PRN

## 2018-12-11 MED ORDER — LACTATED RINGERS IV SOLN: INTRAVENOUS | Status: DC

## 2018-12-11 MED ORDER — CEFAZOLIN SODIUM-DEXTROSE 2-4 GM/100ML-% IV SOLN: 2.0000 g | INTRAVENOUS | Status: DC

## 2018-12-11 MED ORDER — CEFAZOLIN SODIUM-DEXTROSE 2-3 GM-%(50ML) IV SOLR
INTRAVENOUS | Status: DC | PRN
  Administered 2018-12-11: 2 g via INTRAVENOUS

## 2018-12-11 MED ORDER — LIDOCAINE HCL (CARDIAC) PF 100 MG/5ML IV SOSY
PREFILLED_SYRINGE | INTRAVENOUS | Status: DC | PRN
  Administered 2018-12-11: 50 mg via INTRAVENOUS

## 2018-12-11 MED ORDER — PROPOFOL 10 MG/ML IV BOLUS
INTRAVENOUS | Status: DC | PRN
  Administered 2018-12-11: 180 mg via INTRAVENOUS

## 2018-12-11 MED ORDER — DEXAMETHASONE SODIUM PHOSPHATE 10 MG/ML IJ SOLN
INTRAMUSCULAR | Status: DC | PRN
  Administered 2018-12-11: 10 mg via INTRAVENOUS

## 2018-12-11 MED ORDER — FENTANYL CITRATE (PF) 100 MCG/2ML IJ SOLN
INTRAMUSCULAR | Status: AC
  Filled 2018-12-11: qty 2

## 2018-12-11 MED ORDER — BUPIVACAINE HCL (PF) 0.5 % IJ SOLN
INTRAMUSCULAR | Status: AC
  Filled 2018-12-11: qty 30

## 2018-12-11 MED ORDER — MEPERIDINE HCL 25 MG/ML IJ SOLN: 6.2500 mg | INTRAMUSCULAR | Status: DC | PRN

## 2018-12-11 MED ORDER — FENTANYL CITRATE (PF) 100 MCG/2ML IJ SOLN
50.0000 ug | INTRAMUSCULAR | Status: DC | PRN
  Administered 2018-12-11: 50 ug via INTRAVENOUS

## 2018-12-11 MED ORDER — BUPIVACAINE HCL 0.5 % IJ SOLN
INTRAMUSCULAR | Status: DC | PRN
  Administered 2018-12-11: 8 mL

## 2018-12-11 SURGICAL SUPPLY — 67 items
BANDAGE ESMARK 6X9 LF (GAUZE/BANDAGES/DRESSINGS) IMPLANT
BENZOIN TINCTURE PRP APPL 2/3 (GAUZE/BANDAGES/DRESSINGS) IMPLANT
BLADE SURG 15 STRL LF DISP TIS (BLADE) ×2 IMPLANT
BLADE SURG 15 STRL SS (BLADE) ×4
BNDG COHESIVE 4X5 TAN STRL (GAUZE/BANDAGES/DRESSINGS) IMPLANT
BNDG ELASTIC 4X5.8 VLCR STR LF (GAUZE/BANDAGES/DRESSINGS) ×3 IMPLANT
BNDG ELASTIC 6X5.8 VLCR STR LF (GAUZE/BANDAGES/DRESSINGS) IMPLANT
BNDG ESMARK 4X9 LF (GAUZE/BANDAGES/DRESSINGS) IMPLANT
BNDG ESMARK 6X9 LF (GAUZE/BANDAGES/DRESSINGS)
CHLORAPREP W/TINT 26 (MISCELLANEOUS) ×3 IMPLANT
CLOSURE WOUND 1/2 X4 (GAUZE/BANDAGES/DRESSINGS)
COVER BACK TABLE REUSABLE LG (DRAPES) ×3 IMPLANT
COVER WAND RF STERILE (DRAPES) IMPLANT
CUFF TOURN SGL QUICK 34 (TOURNIQUET CUFF)
CUFF TRNQT CYL 34X4.125X (TOURNIQUET CUFF) IMPLANT
DECANTER SPIKE VIAL GLASS SM (MISCELLANEOUS) IMPLANT
DRAPE C-ARMOR (DRAPES) IMPLANT
DRAPE EXTREMITY T 121X128X90 (DISPOSABLE) ×3 IMPLANT
DRAPE HALF SHEET 40X57 (DRAPES) ×3 IMPLANT
DRAPE IMP U-DRAPE 54X76 (DRAPES) ×3 IMPLANT
DRAPE OEC MINIVIEW 54X84 (DRAPES) ×3 IMPLANT
DRAPE U-SHAPE 47X51 STRL (DRAPES) ×3 IMPLANT
ELECT REM PT RETURN 9FT ADLT (ELECTROSURGICAL) ×3
ELECTRODE REM PT RTRN 9FT ADLT (ELECTROSURGICAL) ×1 IMPLANT
GAUZE SPONGE 4X4 12PLY STRL (GAUZE/BANDAGES/DRESSINGS) ×3 IMPLANT
GAUZE XEROFORM 1X8 LF (GAUZE/BANDAGES/DRESSINGS) IMPLANT
GLOVE BIOGEL M STRL SZ7.5 (GLOVE) ×3 IMPLANT
GLOVE BIOGEL PI IND STRL 7.0 (GLOVE) ×2 IMPLANT
GLOVE BIOGEL PI IND STRL 8 (GLOVE) ×1 IMPLANT
GLOVE BIOGEL PI INDICATOR 7.0 (GLOVE) ×4
GLOVE BIOGEL PI INDICATOR 8 (GLOVE) ×2
GLOVE ECLIPSE 6.5 STRL STRAW (GLOVE) ×3 IMPLANT
GOWN STRL REUS W/ TWL LRG LVL3 (GOWN DISPOSABLE) ×1 IMPLANT
GOWN STRL REUS W/ TWL XL LVL3 (GOWN DISPOSABLE) ×1 IMPLANT
GOWN STRL REUS W/TWL LRG LVL3 (GOWN DISPOSABLE) ×2
GOWN STRL REUS W/TWL XL LVL3 (GOWN DISPOSABLE) ×2
K-WIRE .045X4 (WIRE) ×3 IMPLANT
NDL SAFETY ECLIPSE 18X1.5 (NEEDLE) IMPLANT
NEEDLE HYPO 18GX1.5 SHARP (NEEDLE)
NEEDLE HYPO 22GX1.5 SAFETY (NEEDLE) ×3 IMPLANT
NS IRRIG 1000ML POUR BTL (IV SOLUTION) IMPLANT
PACK BASIN DAY SURGERY FS (CUSTOM PROCEDURE TRAY) ×3 IMPLANT
PAD CAST 4YDX4 CTTN HI CHSV (CAST SUPPLIES) IMPLANT
PADDING CAST COTTON 4X4 STRL (CAST SUPPLIES)
PADDING CAST SYNTHETIC 4 (CAST SUPPLIES)
PADDING CAST SYNTHETIC 4X4 STR (CAST SUPPLIES) IMPLANT
PENCIL BUTTON HOLSTER BLD 10FT (ELECTRODE) IMPLANT
PIN BALLS 3/8 F/.045 WIRE (MISCELLANEOUS) ×3 IMPLANT
SLEEVE SCD COMPRESS KNEE MED (MISCELLANEOUS) ×3 IMPLANT
SPLINT FIBERGLASS 4X30 (CAST SUPPLIES) IMPLANT
SPONGE LAP 18X18 RF (DISPOSABLE) ×3 IMPLANT
STOCKINETTE 6  STRL (DRAPES) ×2
STOCKINETTE 6 STRL (DRAPES) ×1 IMPLANT
STRIP CLOSURE SKIN 1/2X4 (GAUZE/BANDAGES/DRESSINGS) IMPLANT
SUCTION FRAZIER HANDLE 10FR (MISCELLANEOUS)
SUCTION TUBE FRAZIER 10FR DISP (MISCELLANEOUS) IMPLANT
SUT ETHILON 3 0 PS 1 (SUTURE) IMPLANT
SUT FIBERWIRE 2-0 18 17.9 3/8 (SUTURE)
SUT MNCRL AB 3-0 PS2 18 (SUTURE) IMPLANT
SUT PDS AB 2-0 CT2 27 (SUTURE) IMPLANT
SUT VIC AB 3-0 FS2 27 (SUTURE) IMPLANT
SUTURE FIBERWR 2-0 18 17.9 3/8 (SUTURE) IMPLANT
SYR 10ML LL (SYRINGE) ×3 IMPLANT
SYR BULB 3OZ (MISCELLANEOUS) IMPLANT
TOWEL GREEN STERILE FF (TOWEL DISPOSABLE) ×6 IMPLANT
TUBE CONNECTING 20'X1/4 (TUBING)
TUBE CONNECTING 20X1/4 (TUBING) IMPLANT

## 2018-12-11 NOTE — Anesthesia Preprocedure Evaluation (Signed)
Anesthesia Evaluation  Patient identified by MRN, date of birth, ID band Patient awake    Reviewed: Allergy & Precautions, NPO status , Patient's Chart, lab work & pertinent test results  Airway Mallampati: II  TM Distance: >3 FB Neck ROM: Full    Dental no notable dental hx.    Pulmonary neg pulmonary ROS,    Pulmonary exam normal breath sounds clear to auscultation       Cardiovascular negative cardio ROS Normal cardiovascular exam Rhythm:Regular Rate:Normal     Neuro/Psych negative neurological ROS  negative psych ROS   GI/Hepatic negative GI ROS, Neg liver ROS,   Endo/Other  negative endocrine ROS  Renal/GU negative Renal ROS  negative genitourinary   Musculoskeletal negative musculoskeletal ROS (+)   Abdominal   Peds negative pediatric ROS (+)  Hematology negative hematology ROS (+)   Anesthesia Other Findings   Reproductive/Obstetrics negative OB ROS                             Anesthesia Physical Anesthesia Plan  ASA: II  Anesthesia Plan: General   Post-op Pain Management:    Induction: Intravenous  PONV Risk Score and Plan: 3 and Ondansetron, Dexamethasone, Midazolam and Treatment may vary due to age or medical condition  Airway Management Planned: LMA  Additional Equipment:   Intra-op Plan:   Post-operative Plan: Extubation in OR  Informed Consent: I have reviewed the patients History and Physical, chart, labs and discussed the procedure including the risks, benefits and alternatives for the proposed anesthesia with the patient or authorized representative who has indicated his/her understanding and acceptance.     Dental advisory given  Plan Discussed with: CRNA  Anesthesia Plan Comments:         Anesthesia Quick Evaluation

## 2018-12-11 NOTE — Transfer of Care (Signed)
Immediate Anesthesia Transfer of Care Note  Patient: Raven Gomez  Procedure(s) Performed: RIGHT 4TH TOE PINNING (Right Toe)  Patient Location: PACU  Anesthesia Type:General  Level of Consciousness: awake, alert  and oriented  Airway & Oxygen Therapy: Patient Spontanous Breathing and Patient connected to face mask oxygen  Post-op Assessment: Report given to RN and Post -op Vital signs reviewed and stable  Post vital signs: Reviewed and stable  Last Vitals:  Vitals Value Taken Time  BP    Temp    Pulse    Resp    SpO2      Last Pain:  Vitals:   12/11/18 1016  TempSrc: Oral  PainSc: 0-No pain      Patients Stated Pain Goal: 3 ([id number])  Complications: No apparent anesthesia complications

## 2018-12-11 NOTE — H&P (Signed)
Raven Gomez is an 48 y.o. female.   Chief Complaint: Right fourth toe pain HPI: Raven Gomez is a 48 year old female here for pinning of her fourth toe.  She sustained an avulsion fracture of her extensor digitorum longus tendon and has drooping at the DIP joint.  We discussed nonoperative as well as pinning as well as open treatment and she opted for pinning.  She denies any recent fevers or chills.  Has pain and swelling in her fourth toe.  This injury occurred approximately 1 week ago.  Past Medical History:  Diagnosis Date  . ADHD   . Allergy   . UTI (urinary tract infection)     Past Surgical History:  Procedure Laterality Date  . CESAREAN SECTION    . CHOLECYSTECTOMY N/A 08/28/2017   Procedure: LAPAROSCOPIC CHOLECYSTECTOMY WITH INTRAOPERATIVE CHOLANGIOGRAM;  Surgeon: Alphonsa Overall, MD;  Location: [facility] ORS;  Service: General;  Laterality: N/A;  . WISDOM TOOTH EXTRACTION  2008    Family History  Problem Relation Age of Onset  . Breast cancer Mother 28       12/2014  . Arthritis Mother   . Cancer Mother   . Depression Mother   . Hypertension Mother   . Stroke Mother   . Cancer Father   . COPD Father   . Heart attack Father   . Hypertension Father   . Depression Brother   . Mental illness Brother   . Cancer Maternal Grandmother   . Depression Maternal Grandmother   . Diabetes Maternal Grandmother   . Mental illness Maternal Grandmother   . Stroke Maternal Grandfather   . Heart disease Paternal Grandfather   . Hypertension Paternal Grandfather    Social History:  reports that she has never smoked. She has never used smokeless tobacco. She reports current alcohol use. She reports that she does not use drugs.  Allergies: No Known Allergies  Medications Prior to Admission  Medication Sig Dispense Refill  . amphetamine-dextroamphetamine (ADDERALL XR) 15 MG 24 hr capsule Take 1 capsule by mouth daily after breakfast. 30 capsule 0  . amphetamine-dextroamphetamine (ADDERALL) 20  MG tablet 1/2 tab po BID prn 30 tablet 0  . FLUoxetine (PROZAC) 20 MG tablet Take 1 tablet (20 mg total) by mouth daily. 90 tablet 3  . magnesium gluconate (MAGONATE) 500 MG tablet Take 500 mg by mouth 2 (two) times daily.    . meclizine (ANTIVERT) 25 MG tablet Take 1 tablet (25 mg total) by mouth 3 (three) times daily as needed for dizziness. 30 tablet 0  . valACYclovir (VALTREX) 1000 MG tablet TK 2 TS PO BID      Results for orders placed or performed during the hospital encounter of 12/11/18 (from the past 48 hour(s))  Pregnancy, urine POC     Status: None   Collection Time: 12/11/18  9:54 AM  Result Value Ref Range   Preg Test, Ur NEGATIVE NEGATIVE    Comment:        THE SENSITIVITY OF THIS METHODOLOGY IS >24 mIU/mL    No results found.  Review of Systems  Constitutional: Negative.   HENT: Negative.   Eyes: Negative.   Respiratory: Negative.   Cardiovascular: Negative.   Gastrointestinal: Negative.   Musculoskeletal:       Right 4th toe pain  Skin: Negative.   Neurological: Negative.   Psychiatric/Behavioral: Negative.     Blood pressure 113/73, pulse 72, temperature 97.9 F (36.6 C), temperature source Oral, resp. rate 16, height 4\' 11"  (1.499 m),  weight 56.7 kg, last menstrual period 11/25/2018, SpO2 100 %. Physical Exam  Constitutional: She appears well-developed.  HENT:  Head: Normocephalic.  Eyes: Conjunctivae are normal.  Neck: Neck supple.  Cardiovascular: Normal rate.  Respiratory: Effort normal.  GI: Soft.  Musculoskeletal:     Comments: Right foot demonstrates ecchymosis and swelling about the fourth toe.  There is a droop of the DIP joint.  She has tenderness to palpation there.  No tenderness along the PIP or MTP joint.  No other areas of tenderness.  Sensation intact.  Foot is warm and well-perfused.  Neurological: She is alert.  Skin: Skin is warm.  Psychiatric: She has a normal mood and affect.     Assessment/Plan We will proceed with reduction  and pinning of her fourth toe.  She understands that this may not result in a normally straight toe once the pain comes out given the avulsion of the tendon.  We did discuss the possibility of open tendon repair but she declined.  Postoperatively she will be in a postoperative shoe and will likely remain with pin in place for 6 weeks.  She understands the risk benefits alternatives of surgery which include but not limited to wound wound healing complications, infection, the pin falling out, and adequate stability of the joint after the pin is removed as well as need for further surgery.  She wished to proceed with surgery.  Erle Crocker, MD 12/11/2018, 11:04 AM

## 2018-12-11 NOTE — Discharge Instructions (Signed)
DR. Lucia Gaskins FOOT & ANKLE SURGERY POST-OP INSTRUCTIONS   Pain Management 1. The numbing medicine and your leg will last around 8 hours, take a dose of your pain medicine as soon as you feel it wearing off to avoid rebound pain. 2. Keep your foot elevated above heart level.  Make sure that your heel hangs free ('floats'). 3. Take all prescribed medication as directed. 4. If taking narcotic pain medication you may want to use an over-the-counter stool softener to avoid constipation. 5. You may take over-the-counter NSAIDs (ibuprofen, naproxen, etc.) as well as over-the-counter acetaminophen as directed on the packaging as a supplement for your pain and may also use it to wean away from the prescription medication.  Activity ? Weighbearing as tolerated in post operative shoe. ? Keep dressing in place  First Postoperative Visit 1. Your first postop visit will be at least 2 weeks after surgery.  This should be scheduled when you schedule surgery. 2. If you do not have a postoperative visit scheduled please call [phone] to schedule an appointment. 3. At the appointment your incision will be evaluated for suture removal, x-rays will be obtained if necessary.  General Instructions 1. Swelling is very common after foot and ankle surgery.  It often takes 3 months for the foot and ankle to begin to feel comfortable.  Some amount of swelling will persist for 6-12 months. 2. DO NOT change the dressing.  If there is a problem with the dressing (too tight, loose, gets wet, etc.) please contact Dr. Pollie Friar office. 3. DO NOT get the dressing wet.  For showers you can use an over-the-counter cast cover or wrap a washcloth around the top of your dressing and then cover it with a plastic bag and tape it to your leg. 4. DO NOT soak the incision (no tubs, pools, bath, etc.) until you have approval from Dr. Lucia Gaskins.  Contact Dr. Huel Cote office or go to Emergency Room if: 1. Temperature above 101 F. 2. Increasing  pain that is unresponsive to pain medication or elevation 3. Excessive redness or swelling in your foot 4. Dressing problems - excessive bloody drainage, looseness or tightness, or if dressing gets wet 5. Develop pain, swelling, warmth, or discoloration of your calf      Post Anesthesia Home Care Instructions  Activity: Get plenty of rest for the remainder of the day. A responsible individual must stay with you for 24 hours following the procedure.  For the next 24 hours, DO NOT: -Drive a car -Paediatric nurse -Drink alcoholic beverages -Take any medication unless instructed by your physician -Make any legal decisions or sign important papers.  Meals: Start with liquid foods such as gelatin or soup. Progress to regular foods as tolerated. Avoid greasy, spicy, heavy foods. If nausea and/or vomiting occur, drink only clear liquids until the nausea and/or vomiting subsides. Call your physician if vomiting continues.  Special Instructions/Symptoms: Your throat may feel dry or sore from the anesthesia or the breathing tube placed in your throat during surgery. If this causes discomfort, gargle with warm salt water. The discomfort should disappear within 24 hours.  If you had a scopolamine patch placed behind your ear for the management of post- operative nausea and/or vomiting:  1. The medication in the patch is effective for 72 hours, after which it should be removed.  Wrap patch in a tissue and discard in the trash. Wash hands thoroughly with soap and water. 2. You may remove the patch earlier than 72 hours if you  experience unpleasant side effects which may include dry mouth, dizziness or visual disturbances. 3. Avoid touching the patch. Wash your hands with soap and water after contact with the patch.

## 2018-12-11 NOTE — Anesthesia Postprocedure Evaluation (Signed)
Anesthesia Post Note  Patient: Raven Gomez  Procedure(s) Performed: RIGHT 4TH TOE PINNING (Right Toe)     Patient location during evaluation: PACU Anesthesia Type: General Level of consciousness: awake and alert Pain management: pain level controlled Vital Signs Assessment: post-procedure vital signs reviewed and stable Respiratory status: spontaneous breathing, nonlabored ventilation, respiratory function stable and patient connected to nasal cannula oxygen Cardiovascular status: blood pressure returned to baseline and stable Postop Assessment: no apparent nausea or vomiting Anesthetic complications: no    Last Vitals:  Vitals:   12/11/18 1215 12/11/18 1230  BP: 117/70 118/79  Pulse: 84 80  Resp: (!) 22 16  Temp:  36.7 C  SpO2: 100% 98%    Last Pain:  Vitals:   12/11/18 1230  TempSrc:   PainSc: 0-No pain                 Montez Hageman

## 2018-12-11 NOTE — Anesthesia Procedure Notes (Signed)
Procedure Name: LMA Insertion Performed by: Verita Lamb, CRNA Pre-anesthesia Checklist: Patient identified, Emergency Drugs available, Suction available, Patient being monitored and Timeout performed Patient Re-evaluated:Patient Re-evaluated prior to induction Oxygen Delivery Method: Circle system utilized Preoxygenation: Pre-oxygenation with 100% oxygen Induction Type: IV induction LMA: LMA inserted LMA Size: 3.0 Tube type: Oral Placement Confirmation: positive ETCO2 and CO2 detector Tube secured with: Tape

## 2018-12-12 ENCOUNTER — Encounter (HOSPITAL_BASED_OUTPATIENT_CLINIC_OR_DEPARTMENT_OTHER): Payer: Self-pay | Admitting: Orthopaedic Surgery

## 2018-12-25 DIAGNOSIS — S93114A Dislocation of interphalangeal joint of right lesser toe(s), initial encounter: Secondary | ICD-10-CM | POA: Diagnosis not present

## 2018-12-25 NOTE — Op Note (Signed)
  Raven Gomez [facility] female 48 y.o. 12/11/2018   PreOperative Diagnosis: Right fourth toe distal phalanx fracture with distal interphalangeal joint subluxation   PostOperative Diagnosis: Same  PROCEDURE: Closed reduction of right fourth toe distal interphalangeal joint with percutaneous pin fixation  SURGEON: Melony Overly, MD  ASSISTANT: None  ANESTHESIA: General LMA with digital block  FINDINGS: Fourth toe distal interphalangeal joint dislocation  IMPLANTS: 45 K wire  INDICATIONS:48 y.o. female Had an injury to her right fourth toe with subluxation of the DIP joint on evaluation.  We discussed conservative treatment the form of continued immobilization versus pin fixation.  She opted for pin fixation.  She understood the risks, benefits and alternatives of surgery which include but are not limited to wound healing comp locations, infection, nonunion, malunion, need for further surgery and damage to any structures.  PROCEDURE:Patient was identified in the preoperative holding area.  The right foot was marked by myself.  Consent was signed by myself and the patient.  She was taken the operative suite and placed supine to the operative table.  General anesthesia was induced without difficulty.  The right lower external he was prepped and draped in the usual sterile fashion and preoperative antibiotics were given.  Using C-arm fluoroscopy the fourth toe was evaluated.  There was subluxation of the DIP joint with small bony fragmentation dorsally at the site of the extensor digitorum longus tendon.  The toe was manually reduced and confirmed under fluoroscopy.  Then a 045 K wire was placed across the joint to hold the fixation.  She tolerated this well.  There were no complications.  A Jurgan's ball was placed on the end of the wire and it was cut to length.  There was good stability of the fractureJ and joint.  Soft dressing was placed.  POST OPERATIVE INSTRUCTIONS: Keep dressing  in place for 2 days.  Wear postoperative shoe. Call office with concerns. Follow Up in 2 weeks  TOURNIQUET TIME:No tourniquet was used  BLOOD LOSS:  Minimal         DRAINS: none         SPECIMEN: none       COMPLICATIONS:  * No complications entered in OR log *         Disposition: PACU - hemodynamically stable.         Condition: stable

## 2019-01-08 ENCOUNTER — Other Ambulatory Visit: Payer: Self-pay | Admitting: Family Medicine

## 2019-01-09 ENCOUNTER — Other Ambulatory Visit: Payer: Self-pay | Admitting: Family Medicine

## 2019-01-10 MED ORDER — AMPHETAMINE-DEXTROAMPHETAMINE 20 MG PO TABS: ORAL_TABLET | ORAL | 0 refills | Status: DC

## 2019-01-10 NOTE — Telephone Encounter (Signed)
Rx Request 

## 2019-01-10 NOTE — Telephone Encounter (Signed)
Rx request 

## 2019-01-22 DIAGNOSIS — S93114A Dislocation of interphalangeal joint of right lesser toe(s), initial encounter: Secondary | ICD-10-CM | POA: Diagnosis not present

## 2019-02-12 ENCOUNTER — Other Ambulatory Visit: Payer: Self-pay | Admitting: Family Medicine

## 2019-02-13 NOTE — Telephone Encounter (Signed)
Rx request 

## 2019-02-14 MED ORDER — AMPHETAMINE-DEXTROAMPHET ER 15 MG PO CP24: 15.0000 mg | ORAL_CAPSULE | Freq: Every day | ORAL | 0 refills | Status: DC

## 2019-02-14 NOTE — Telephone Encounter (Signed)
Refilled, but due for 3 month f/u.  Orma Flaming, MD [place]

## 2019-02-21 DIAGNOSIS — S93114A Dislocation of interphalangeal joint of right lesser toe(s), initial encounter: Secondary | ICD-10-CM | POA: Diagnosis not present

## 2019-03-06 ENCOUNTER — Other Ambulatory Visit: Payer: Self-pay | Admitting: Family Medicine

## 2019-03-06 NOTE — Telephone Encounter (Signed)
Requesting refill, can you fill due to Dr. Rogers Blocker on vacation all week.

## 2019-03-07 MED ORDER — AMPHETAMINE-DEXTROAMPHETAMINE 20 MG PO TABS: ORAL_TABLET | ORAL | 0 refills | Status: DC

## 2019-04-02 ENCOUNTER — Other Ambulatory Visit: Payer: Self-pay | Admitting: Family Medicine

## 2019-04-02 MED ORDER — AMPHETAMINE-DEXTROAMPHET ER 15 MG PO CP24: 15.0000 mg | ORAL_CAPSULE | Freq: Every day | ORAL | 0 refills | Status: DC

## 2019-04-02 NOTE — Telephone Encounter (Signed)
Rx Request 

## 2019-04-04 ENCOUNTER — Ambulatory Visit (INDEPENDENT_AMBULATORY_CARE_PROVIDER_SITE_OTHER): Payer: BC Managed Care – PPO | Admitting: Family Medicine

## 2019-04-04 ENCOUNTER — Other Ambulatory Visit: Payer: Self-pay

## 2019-04-04 ENCOUNTER — Encounter: Payer: Self-pay | Admitting: Family Medicine

## 2019-04-04 VITALS — BP 122/80 | HR 88 | Temp 97.9°F | Ht 59.0 in | Wt 139.8 lb

## 2019-04-04 DIAGNOSIS — F411 Generalized anxiety disorder: Secondary | ICD-10-CM

## 2019-04-04 DIAGNOSIS — F988 Other specified behavioral and emotional disorders with onset usually occurring in childhood and adolescence: Secondary | ICD-10-CM

## 2019-04-04 MED ORDER — FLUOXETINE HCL 20 MG PO TABS: 20.0000 mg | ORAL_TABLET | Freq: Every day | ORAL | 3 refills | Status: DC

## 2019-04-04 NOTE — Progress Notes (Signed)
Patient: Raven Gomez MRN: [id number] DOB: [birth year] PCP: Orma Flaming, MD     Subjective:  Chief Complaint  Patient presents with  . ADD  . Anxiety    HPI: The patient is a 49 y.o. female who presents today for routine ADD follow up. She is doing well on her medication and filling correctly. Her only complaint is she has gained 15 pounds since September. She is not working out, but diet is the same. She has had no headaches, insomnia, GI upset. Taking appropriately and does take drug holidays. Work is going well.   GAD: currently on prozac 20mg /day. Very well controlled and happy with medication. No changes to current plan.   Review of Systems  Constitutional: Positive for unexpected weight change (gain since september ). Negative for chills, fatigue and fever.  HENT: Positive for sinus pressure. Negative for sinus pain and sore throat.        Being treated  Respiratory: Negative for cough, shortness of breath and wheezing.   Cardiovascular: Negative for chest pain, palpitations and leg swelling.  Gastrointestinal: Negative for abdominal distention, abdominal pain, diarrhea and nausea.  Genitourinary: Negative for flank pain, pelvic pain and urgency.  Neurological: Negative for dizziness, light-headedness and headaches.  Psychiatric/Behavioral: Negative for sleep disturbance. The patient is not nervous/anxious.     Allergies Patient has No Known Allergies.  Past Medical History Patient  has a past medical history of ADHD, Allergy, and UTI (urinary tract infection).  Surgical History Patient  has a past surgical history that includes Wisdom tooth extraction (2008); Cesarean section; Cholecystectomy (N/A, 08/28/2017); and ORIF toe fracture (Right, 12/11/2018).  Family History Pateint's family history includes Arthritis in her mother; Breast cancer (age of onset: 39) in her mother; COPD in her father; Cancer in her father, maternal grandmother, and mother; Depression in her  brother, maternal grandmother, and mother; Diabetes in her maternal grandmother; Heart attack in her father; Heart disease in her paternal grandfather; Hypertension in her father, mother, and paternal grandfather; Mental illness in her brother and maternal grandmother; Stroke in her maternal grandfather and mother.  Social History Patient  reports that she has never smoked. She has never used smokeless tobacco. She reports current alcohol use. She reports that she does not use drugs.    Objective: Vitals:   04/04/19 0858  BP: 122/80  Pulse: 88  Temp: 97.9 F (36.6 C)  TempSrc: Temporal  SpO2: 99%  Weight: 139 lb 12.8 oz (63.4 kg)  Height: 4\' 11"  (1.499 m)    Body mass index is 28.24 kg/m.  Physical Exam Vitals reviewed.  Constitutional:      Appearance: Normal appearance. She is normal weight.  HENT:     Head: Normocephalic and atraumatic.     Right Ear: Tympanic membrane, ear canal and external ear normal.     Left Ear: Tympanic membrane, ear canal and external ear normal.     Nose: Nose normal.     Comments: No TTP over sinuses  Eyes:     Extraocular Movements: Extraocular movements intact.     Pupils: Pupils are equal, round, and reactive to light.  Cardiovascular:     Rate and Rhythm: Normal rate and regular rhythm.     Heart sounds: Normal heart sounds.  Pulmonary:     Effort: Pulmonary effort is normal.     Breath sounds: Normal breath sounds.  Abdominal:     General: Abdomen is flat. Bowel sounds are normal.     Palpations: Abdomen is  soft.  Musculoskeletal:     Cervical back: Normal range of motion and neck supple.  Neurological:     General: No focal deficit present.     Mental Status: She is alert and oriented to person, place, and time.  Psychiatric:        Mood and Affect: Mood normal.        Behavior: Behavior normal.        GAD 7 : Generalized Anxiety Score 04/04/2019 04/05/2018 02/23/2018  Nervous, Anxious, on Edge 0 1 1  Control/stop worrying 0 1  2  Worry too much - different things 1 1 1   Trouble relaxing 0 0 1  Restless 0 0 0  Easily annoyed or irritable 0 0 0  Afraid - awful might happen 0 0 0  Total GAD 7 Score 1 3 5   Anxiety Difficulty - Not difficult at all Somewhat difficult     Assessment/plan: 1. Attention deficit disorder (ADD) without hyperactivity pmp website verified and filling correctly. Continue current dosage. Tolerating well with no side effects and efficacious dosage. F/u in 3 months for routine follow up.   2. GAD (generalized anxiety disorder) gad7 score non significant and very well controlled. Refills given. F/u in 6 months or as needed. Encouraged exercise.    This visit occurred during the SARS-CoV-2 public health emergency.  Safety protocols were in place, including screening questions prior to the visit, additional usage of staff PPE, and extensive cleaning of exam room while observing appropriate contact time as indicated for disinfecting solutions.    Return in about 3 months (around 07/02/2019) for annual/fasting labs/add f/u .   Orma Flaming, MD [place]   04/04/2019

## 2019-04-08 ENCOUNTER — Other Ambulatory Visit: Payer: Self-pay | Admitting: Family Medicine

## 2019-04-23 ENCOUNTER — Other Ambulatory Visit: Payer: Self-pay | Admitting: Family Medicine

## 2019-04-23 ENCOUNTER — Ambulatory Visit: Payer: BC Managed Care – PPO | Admitting: Family Medicine

## 2019-04-23 NOTE — Telephone Encounter (Signed)
LOV: 04/04/2019 NOV: 07/04/2019  Approve?

## 2019-04-24 MED ORDER — AMPHETAMINE-DEXTROAMPHETAMINE 20 MG PO TABS: ORAL_TABLET | ORAL | 0 refills | Status: DC

## 2019-05-19 ENCOUNTER — Other Ambulatory Visit: Payer: Self-pay | Admitting: Family Medicine

## 2019-05-29 ENCOUNTER — Other Ambulatory Visit: Payer: Self-pay | Admitting: Family Medicine

## 2019-05-30 MED ORDER — AMPHETAMINE-DEXTROAMPHET ER 15 MG PO CP24: 15.0000 mg | ORAL_CAPSULE | Freq: Every day | ORAL | 0 refills | Status: DC

## 2019-05-30 MED ORDER — AMPHETAMINE-DEXTROAMPHETAMINE 20 MG PO TABS: ORAL_TABLET | ORAL | 0 refills | Status: DC

## 2019-05-30 NOTE — Telephone Encounter (Signed)
LAST APPOINTMENT DATE: 04/04/2019  NEXT APPOINTMENT DATE:@5 /26/2021  RX Adderall 15mg  LAST REFILL: 04/02/2019  QTY:30 0Rf   RX Adderall 20mg  LAST REFILL 04/24/2019  QTY 30 0Rf

## 2019-06-28 DIAGNOSIS — M1811 Unilateral primary osteoarthritis of first carpometacarpal joint, right hand: Secondary | ICD-10-CM | POA: Diagnosis not present

## 2019-07-04 ENCOUNTER — Other Ambulatory Visit: Payer: Self-pay

## 2019-07-04 ENCOUNTER — Ambulatory Visit (INDEPENDENT_AMBULATORY_CARE_PROVIDER_SITE_OTHER): Payer: BC Managed Care – PPO | Admitting: Family Medicine

## 2019-07-04 ENCOUNTER — Encounter: Payer: Self-pay | Admitting: Family Medicine

## 2019-07-04 VITALS — BP 110/76 | HR 98 | Temp 97.9°F | Ht 59.0 in | Wt 133.8 lb

## 2019-07-04 DIAGNOSIS — F988 Other specified behavioral and emotional disorders with onset usually occurring in childhood and adolescence: Secondary | ICD-10-CM | POA: Diagnosis not present

## 2019-07-04 DIAGNOSIS — Z1211 Encounter for screening for malignant neoplasm of colon: Secondary | ICD-10-CM | POA: Diagnosis not present

## 2019-07-04 DIAGNOSIS — F411 Generalized anxiety disorder: Secondary | ICD-10-CM | POA: Diagnosis not present

## 2019-07-04 DIAGNOSIS — Z0000 Encounter for general adult medical examination without abnormal findings: Secondary | ICD-10-CM | POA: Diagnosis not present

## 2019-07-04 LAB — COMPREHENSIVE METABOLIC PANEL
ALT: 10 U/L (ref 0–35)
AST: 14 U/L (ref 0–37)
Albumin: 4.5 g/dL (ref 3.5–5.2)
Alkaline Phosphatase: 52 U/L (ref 39–117)
BUN: 15 mg/dL (ref 6–23)
CO2: 27 mEq/L (ref 19–32)
Calcium: 9.5 mg/dL (ref 8.4–10.5)
Chloride: 102 mEq/L (ref 96–112)
Creatinine, Ser: 0.77 mg/dL (ref 0.40–1.20)
GFR: 79.23 mL/min (ref >60.00)
Glucose, Bld: 104 mg/dL — ABNORMAL HIGH (ref 70–99)
Potassium: 5.1 mEq/L (ref 3.5–5.1)
Sodium: 134 mEq/L — ABNORMAL LOW (ref 135–145)
Total Bilirubin: 0.9 mg/dL (ref 0.2–1.2)
Total Protein: 6.7 g/dL (ref 6.0–8.3)

## 2019-07-04 LAB — CBC WITH DIFFERENTIAL/PLATELET
Basophils Absolute: 0 10*3/uL (ref 0.0–0.1)
Basophils Relative: 0.3 % (ref 0.0–3.0)
Eosinophils Absolute: 0 10*3/uL (ref 0.0–0.7)
Eosinophils Relative: 1.1 % (ref 0.0–5.0)
HCT: 40.9 % (ref 36.0–46.0)
Hemoglobin: 14.2 g/dL (ref 12.0–15.0)
Lymphocytes Relative: 35.4 % (ref 12.0–46.0)
Lymphs Abs: 1.3 10*3/uL (ref 0.7–4.0)
MCHC: 34.7 g/dL (ref 30.0–36.0)
MCV: 98.8 fl (ref 78.0–100.0)
Monocytes Absolute: 0.6 10*3/uL (ref 0.1–1.0)
Monocytes Relative: 16.3 % — ABNORMAL HIGH (ref 3.0–12.0)
Neutro Abs: 1.8 10*3/uL (ref 1.4–7.7)
Neutrophils Relative %: 46.9 % (ref 43.0–77.0)
Platelets: 217 10*3/uL (ref 150.0–400.0)
RBC: 4.15 Mil/uL (ref 3.87–5.11)
RDW: 12.9 % (ref 11.5–15.5)
WBC: 3.7 10*3/uL — ABNORMAL LOW (ref 4.0–10.5)

## 2019-07-04 LAB — LIPID PANEL
Cholesterol: 167 mg/dL (ref 0–200)
HDL: 78.7 mg/dL (ref >39.00)
LDL Cholesterol: 79 mg/dL (ref 0–99)
NonHDL: 88.22
Total CHOL/HDL Ratio: 2
Triglycerides: 47 mg/dL (ref 0.0–149.0)
VLDL: 9.4 mg/dL (ref 0.0–40.0)

## 2019-07-04 LAB — TSH: TSH: 1.52 u[IU]/mL (ref 0.35–4.50)

## 2019-07-04 MED ORDER — AMPHETAMINE-DEXTROAMPHETAMINE 20 MG PO TABS: ORAL_TABLET | ORAL | 0 refills | Status: DC

## 2019-07-04 NOTE — Progress Notes (Signed)
Patient: Raven Gomez MRN: [id number] DOB: [birth year] PCP: Orma Flaming, MD     Subjective:  Chief Complaint  Patient presents with  . Annual Exam  . ADD    HPI: The patient is a 49 y.o. female who presents today for annual exam. She denies any changes to past medical history. There has been one recent hospitalization.  She had surgery on her right 4th toe in November of 2020. They are following a well balanced diet and exercise plan. Weight has been decreasing steadily. No complaints today.   Hx of breast cancer in mother. No colon cancer in family.   ADD Routine 3 month follow up. Currently on adderall XR 72m daily and adderall 167mprn.  She actually stopped takin the 1577mR and is only taking the 67m72m. She states this works much better for her and she is doing better on it. She will occasionally use 1/2 tab in the evening if she has a late client.  She is doing well on her medication and filling correctly.  She has had no headaches, insomnia, GI upset. Taking appropriately and does take drug holidays. Work is going well. Last filled on 05/30/19.   She has hx of anxiety, but has been doing well and stopped her prozac and is doing great. She states she doesn't miss it all.   Immunization History  Administered Date(s) Administered  . DTaP 04/23/1969, 05/30/1969, 07/01/1969, 08/05/1969  . IPV 05/30/1969, 08/05/1969, 10/07/1969, 09/09/1970  . MMR 03/07/1970   Colonoscopy: never. Referral today  Mammogram: 11/16/2018 Pap smear: UTD. 2019. Normal.    Review of Systems  Constitutional: Negative for chills, fatigue and fever.  HENT: Negative for dental problem, ear pain, hearing loss and trouble swallowing.   Eyes: Negative for visual disturbance.  Respiratory: Negative for cough, chest tightness and shortness of breath.   Cardiovascular: Negative for chest pain, palpitations and leg swelling.  Gastrointestinal: Negative for abdominal pain, blood in stool, diarrhea and  nausea.  Endocrine: Negative for cold intolerance, polydipsia, polyphagia and polyuria.  Genitourinary: Negative for dysuria and hematuria.  Musculoskeletal: Negative for arthralgias.  Skin: Negative for rash.  Neurological: Negative for dizziness and headaches.  Psychiatric/Behavioral: Negative for dysphoric mood and sleep disturbance. The patient is not nervous/anxious.     Allergies Patient has No Known Allergies.  Past Medical History Patient  has a past medical history of ADHD, Allergy, and UTI (urinary tract infection).  Surgical History Patient  has a past surgical history that includes Wisdom tooth extraction (2008); Cesarean section; Cholecystectomy (N/A, 08/28/2017); and ORIF toe fracture (Right, 12/11/2018).  Family History Pateint's family history includes Arthritis in her mother; Breast cancer (age of onset: 67) 95 her mother; COPD in her father; Cancer in her father, maternal grandmother, and mother; Depression in her brother, maternal grandmother, and mother; Diabetes in her maternal grandmother; Heart attack in her father; Heart disease in her paternal grandfather; Hypertension in her father, mother, and paternal grandfather; Mental illness in her brother and maternal grandmother; Stroke in her maternal grandfather and mother.  Social History Patient  reports that she has never smoked. She has never used smokeless tobacco. She reports current alcohol use. She reports that she does not use drugs.    Objective: Vitals:   07/04/19 0954  BP: 110/76  Pulse: 98  Temp: 97.9 F (36.6 C)  TempSrc: Temporal  SpO2: 99%  Weight: 133 lb 12.8 oz (60.7 kg)  Height: 4' 11"  (1.499 m)    Body mass index  is 27.02 kg/m.  Physical Exam Vitals reviewed.  Constitutional:      Appearance: Normal appearance. She is well-developed and normal weight.  HENT:     Head: Normocephalic and atraumatic.     Right Ear: Tympanic membrane, ear canal and external ear normal.     Left Ear:  Tympanic membrane, ear canal and external ear normal.     Nose: Nose normal.     Mouth/Throat:     Mouth: Mucous membranes are moist.  Eyes:     Extraocular Movements: Extraocular movements intact.     Conjunctiva/sclera: Conjunctivae normal.     Pupils: Pupils are equal, round, and reactive to light.  Neck:     Thyroid: No thyromegaly.  Cardiovascular:     Rate and Rhythm: Normal rate and regular rhythm.     Pulses: Normal pulses.     Heart sounds: Normal heart sounds. No murmur.  Pulmonary:     Effort: Pulmonary effort is normal.     Breath sounds: Normal breath sounds.  Abdominal:     General: Abdomen is flat. Bowel sounds are normal. There is no distension.     Palpations: Abdomen is soft.     Tenderness: There is no abdominal tenderness.  Musculoskeletal:     Cervical back: Normal range of motion and neck supple.  Lymphadenopathy:     Cervical: No cervical adenopathy.  Skin:    General: Skin is warm and dry.     Capillary Refill: Capillary refill takes less than 2 seconds.     Findings: No rash.  Neurological:     General: No focal deficit present.     Mental Status: She is alert and oriented to person, place, and time.     Cranial Nerves: No cranial nerve deficit.     Coordination: Coordination normal.     Deep Tendon Reflexes: Reflexes normal.  Psychiatric:        Mood and Affect: Mood normal.        Behavior: Behavior normal.      Office Visit from 07/04/2019 in [place]  PHQ-2 Total Score  1     GAD 7 : Generalized Anxiety Score 07/04/2019 04/04/2019 04/05/2018 02/23/2018  Nervous, Anxious, on Edge 0 0 1 1  Control/stop worrying 0 0 1 2  Worry too much - different things 1 1 1 1   Trouble relaxing 0 0 0 1  Restless 0 0 0 0  Easily annoyed or irritable 1 0 0 0  Afraid - awful might happen 0 0 0 0  Total GAD 7 Score 2 1 3 5   Anxiety Difficulty Not difficult at all - Not difficult at all Somewhat difficult          Assessment/plan: 1. Annual physical exam Routine fasting labs today. HM reviewed. Requesting pap smear and colonoscopy ordered, otherwise utd. Continue healthy lifestyle/eating and weight loss. F/u in one year for annual.  Patient counseling [x]    Nutrition: Stressed importance of moderation in sodium/caffeine intake, saturated fat and cholesterol, caloric balance, sufficient intake of fresh fruits, vegetables, fiber, calcium, iron, and 1 mg of folate supplement per day (for females capable of pregnancy).  [x]    Stressed the importance of regular exercise.   []    Substance Abuse: Discussed cessation/primary prevention of tobacco, alcohol, or other drug use; driving or other dangerous activities under the influence; availability of treatment for abuse.   [x]    Injury prevention: Discussed safety belts, safety helmets, smoke detector, smoking near bedding  or upholstery.   [x]    Sexuality: Discussed sexually transmitted diseases, partner selection, use of condoms, avoidance of unintended pregnancy  and contraceptive alternatives.  [x]    Dental health: Discussed importance of regular tooth brushing, flossing, and dental visits.  [x]    Health maintenance and immunizations reviewed. Please refer to Health maintenance section.    - CBC with Differential/Platelet - Comprehensive metabolic panel - Lipid panel - TSH  2. Attention deficit disorder (ADD) without hyperactivity Im fine with her doing adderall 75m IR in the AM. Stopped the XR px. Will have her f/u in 3 months for routine add appointment. PMP verified.   3. Colon cancer screening  - Ambulatory referral to Gastroenterology  4. GAD (generalized anxiety disorder) GAD7 is very well controlled off medication. Happy she is doing well without need for px drugs. If any issues she is to let me know, but sees me regularly already every 3 months.     This visit occurred during the SARS-CoV-2 public health emergency.  Safety protocols were in  place, including screening questions prior to the visit, additional usage of staff PPE, and extensive cleaning of exam room while observing appropriate contact time as indicated for disinfecting solutions.     Return in about 3 months (around 10/04/2019) for add routine f/u .     AOrma Flaming MD [place] 07/04/2019

## 2019-07-04 NOTE — Patient Instructions (Signed)
-referral for colonoscopy, they will call you for this.  -see if you can get your pap smear results for me so I can scan into your chart.  -routine labs today -refilled your adderall 30m.   Proud of you for weaning off prozac!   See ya in 3 months!    Preventive Care [id number] Old, Female Preventive care refers to visits with your health care provider and lifestyle choices that can promote health and wellness. This includes:  A yearly physical exam. This may also be called an annual well check.  Regular dental visits and eye exams.  Immunizations.  Screening for certain conditions.  Healthy lifestyle choices, such as eating a healthy diet, getting regular exercise, not using drugs or products that contain nicotine and tobacco, and limiting alcohol use. What can I expect for my preventive care visit? Physical exam Your health care provider will check your:  Height and weight. This may be used to calculate body mass index (BMI), which tells if you are at a healthy weight.  Heart rate and blood pressure.  Skin for abnormal spots. Counseling Your health care provider may ask you questions about your:  Alcohol, tobacco, and drug use.  Emotional well-being.  Home and relationship well-being.  Sexual activity.  Eating habits.  Work and work eStatistician  Method of birth control.  Menstrual cycle.  Pregnancy history. What immunizations do I need?  Influenza (flu) vaccine  This is recommended every year. Tetanus, diphtheria, and pertussis (Tdap) vaccine  You may need a Td booster every 10 years. Varicella (chickenpox) vaccine  You may need this if you have not been vaccinated. Zoster (shingles) vaccine  You may need this after age 50 Measles, mumps, and rubella (MMR) vaccine  You may need at least one dose of MMR if you were born in 1957 or later. You may also need a second dose. Pneumococcal conjugate (PCV13) vaccine  You may need this if you have  certain conditions and were not previously vaccinated. Pneumococcal polysaccharide (PPSV23) vaccine  You may need one or two doses if you smoke cigarettes or if you have certain conditions. Meningococcal conjugate (MenACWY) vaccine  You may need this if you have certain conditions. Hepatitis A vaccine  You may need this if you have certain conditions or if you travel or work in places where you may be exposed to hepatitis A. Hepatitis B vaccine  You may need this if you have certain conditions or if you travel or work in places where you may be exposed to hepatitis B. Haemophilus influenzae type b (Hib) vaccine  You may need this if you have certain conditions. Human papillomavirus (HPV) vaccine  If recommended by your health care provider, you may need three doses over 6 months. You may receive vaccines as individual doses or as more than one vaccine together in one shot (combination vaccines). Talk with your health care provider about the risks and benefits of combination vaccines. What tests do I need? Blood tests  Lipid and cholesterol levels. These may be checked every 5 years, or more frequently if you are over 550years old.  Hepatitis C test.  Hepatitis B test. Screening  Lung cancer screening. You may have this screening every year starting at age 2550if you have a 30-pack-year history of smoking and currently smoke or have quit within the past 15 years.  Colorectal cancer screening. All adults should have this screening starting at age 2550and continuing until age 50 Your health care  provider may recommend screening at age 15 if you are at increased risk. You will have tests every 1-10 years, depending on your results and the type of screening test.  Diabetes screening. This is done by checking your blood sugar (glucose) after you have not eaten for a while (fasting). You may have this done every 1-3 years.  Mammogram. This may be done every 1-2 years. Talk with your  health care provider about when you should start having regular mammograms. This may depend on whether you have a family history of breast cancer.  BRCA-related cancer screening. This may be done if you have a family history of breast, ovarian, tubal, or peritoneal cancers.  Pelvic exam and Pap test. This may be done every 3 years starting at age 50. Starting at age 50, this may be done every 5 years if you have a Pap test in combination with an HPV test. Other tests  Sexually transmitted disease (STD) testing.  Bone density scan. This is done to screen for osteoporosis. You may have this scan if you are at high risk for osteoporosis. Follow these instructions at home: Eating and drinking  Eat a diet that includes fresh fruits and vegetables, whole grains, lean protein, and low-fat dairy.  Take vitamin and mineral supplements as recommended by your health care provider.  Do not drink alcohol if: ? Your health care provider tells you not to drink. ? You are pregnant, may be pregnant, or are planning to become pregnant.  If you drink alcohol: ? Limit how much you have to 0-1 drink a day. ? Be aware of how much alcohol is in your drink. In the [place] one drink equals one 12 oz bottle of beer (355 mL), one 5 oz glass of wine (148 mL), or one 1 oz glass of hard liquor (44 mL). Lifestyle  Take daily care of your teeth and gums.  Stay active. Exercise for at least 30 minutes on 5 or more days each week.  Do not use any products that contain nicotine or tobacco, such as cigarettes, e-cigarettes, and chewing tobacco. If you need help quitting, ask your health care provider.  If you are sexually active, practice safe sex. Use a condom or other form of birth control (contraception) in order to prevent pregnancy and STIs (sexually transmitted infections).  If told by your health care provider, take low-dose aspirin daily starting at age 18. What's next?  Visit your health care provider once  a year for a well check visit.  Ask your health care provider how often you should have your eyes and teeth checked.  Stay up to date on all vaccines. This information is not intended to replace advice given to you by your health care provider. Make sure you discuss any questions you have with your health care provider. Document Revised: 10/06/2017 Document Reviewed: 10/06/2017 Elsevier Patient Education  2020 [employer].

## 2019-07-04 NOTE — Progress Notes (Deleted)
Patient: Raven Gomez MRN: [id number] DOB: 1970-07-11 PCP: Orma Flaming, MD     Subjective:  Chief Complaint  Patient presents with  . Annual Exam  . ADD    HPI: The patient is a 49 y.o. female who presents today for annual exam. She denies any changes to past medical history. There have been no recent hospitalizations. They {Actions; are/are not:16769} following a well balanced diet and exercise plan. Weight has been {trend:16658}. No complaints today.    Immunization History  Administered Date(s) Administered  . DTaP 04/23/1969, 05/30/1969, 07/01/1969, 08/05/1969  . IPV 05/30/1969, 08/05/1969, 10/07/1969, 09/09/1970  . MMR 03/07/1970   Colonoscopy: Mammogram:  Pap smear:  PSA:   Review of Systems  Respiratory: Negative for cough, shortness of breath and wheezing.   Cardiovascular: Negative for chest pain and palpitations.  Gastrointestinal: Negative for abdominal pain, nausea and vomiting.  Neurological: Negative for dizziness, light-headedness and headaches.    Allergies Patient has No Known Allergies.  Past Medical History Patient  has a past medical history of ADHD, Allergy, and UTI (urinary tract infection).  Surgical History Patient  has a past surgical history that includes Wisdom tooth extraction (2008); Cesarean section; Cholecystectomy (N/A, 08/28/2017); and ORIF toe fracture (Right, 12/11/2018).  Family History Pateint's family history includes Arthritis in her mother; Breast cancer (age of onset: 76) in her mother; COPD in her father; Cancer in her father, maternal grandmother, and mother; Depression in her brother, maternal grandmother, and mother; Diabetes in her maternal grandmother; Heart attack in her father; Heart disease in her paternal grandfather; Hypertension in her father, mother, and paternal grandfather; Mental illness in her brother and maternal grandmother; Stroke in her maternal grandfather and mother.  Social History Patient  reports  that she has never smoked. She has never used smokeless tobacco. She reports current alcohol use. She reports that she does not use drugs.    Objective: Vitals:   07/04/19 0954  BP: 110/76  Pulse: 98  Temp: 97.9 F (36.6 C)  TempSrc: Temporal  SpO2: 99%  Weight: 133 lb 12.8 oz (60.7 kg)  Height: 4' 11" (1.499 m)    Body mass index is 27.02 kg/m.  Physical Exam     Assessment/plan:   No problem-specific Assessment & Plan notes found for this encounter.    No follow-ups on file.     Tobe Sos, MD [place]  07/04/2019

## 2019-07-05 ENCOUNTER — Encounter: Payer: Self-pay | Admitting: Gastroenterology

## 2019-07-26 ENCOUNTER — Other Ambulatory Visit: Payer: Self-pay

## 2019-07-26 ENCOUNTER — Ambulatory Visit: Payer: BC Managed Care – PPO | Admitting: *Deleted

## 2019-07-26 VITALS — Ht 59.0 in | Wt 120.0 lb

## 2019-07-26 DIAGNOSIS — Z1211 Encounter for screening for malignant neoplasm of colon: Secondary | ICD-10-CM

## 2019-07-26 DIAGNOSIS — Z01818 Encounter for other preprocedural examination: Secondary | ICD-10-CM

## 2019-07-26 NOTE — Progress Notes (Signed)

## 2019-07-27 ENCOUNTER — Encounter: Payer: Self-pay | Admitting: Gastroenterology

## 2019-08-02 DIAGNOSIS — M1811 Unilateral primary osteoarthritis of first carpometacarpal joint, right hand: Secondary | ICD-10-CM | POA: Diagnosis not present

## 2019-08-06 ENCOUNTER — Ambulatory Visit (INDEPENDENT_AMBULATORY_CARE_PROVIDER_SITE_OTHER): Payer: BC Managed Care – PPO

## 2019-08-06 ENCOUNTER — Other Ambulatory Visit: Payer: Self-pay | Admitting: Gastroenterology

## 2019-08-06 DIAGNOSIS — Z1159 Encounter for screening for other viral diseases: Secondary | ICD-10-CM | POA: Diagnosis not present

## 2019-08-07 ENCOUNTER — Other Ambulatory Visit: Payer: Self-pay | Admitting: Family Medicine

## 2019-08-07 LAB — SARS CORONAVIRUS 2 (TAT 6-24 HRS): SARS Coronavirus 2: NEGATIVE

## 2019-08-08 MED ORDER — AMPHETAMINE-DEXTROAMPHETAMINE 20 MG PO TABS: ORAL_TABLET | ORAL | 0 refills | Status: DC

## 2019-08-09 ENCOUNTER — Ambulatory Visit (AMBULATORY_SURGERY_CENTER): Payer: BC Managed Care – PPO | Admitting: Gastroenterology

## 2019-08-09 ENCOUNTER — Other Ambulatory Visit: Payer: Self-pay

## 2019-08-09 ENCOUNTER — Encounter: Payer: Self-pay | Admitting: Gastroenterology

## 2019-08-09 VITALS — BP 126/72 | HR 73 | Temp 98.0°F | Resp 13 | Ht 59.0 in | Wt 120.0 lb

## 2019-08-09 DIAGNOSIS — D120 Benign neoplasm of cecum: Secondary | ICD-10-CM

## 2019-08-09 DIAGNOSIS — Z1211 Encounter for screening for malignant neoplasm of colon: Secondary | ICD-10-CM | POA: Diagnosis not present

## 2019-08-09 DIAGNOSIS — D123 Benign neoplasm of transverse colon: Secondary | ICD-10-CM | POA: Diagnosis not present

## 2019-08-09 MED ORDER — SODIUM CHLORIDE 0.9 % IV SOLN
500.0000 mL | Freq: Once | INTRAVENOUS | Status: DC
Start: 2019-08-09 — End: 2022-11-16

## 2019-08-09 NOTE — Progress Notes (Signed)
Vitals-CW  Pt's states no medical or surgical changes since previsit or office visit. 

## 2019-08-09 NOTE — Progress Notes (Signed)
Called to room to assist during endoscopic procedure.  Patient ID and intended procedure confirmed with present staff. Received instructions for my participation in the procedure from the performing physician.  

## 2019-08-09 NOTE — Patient Instructions (Signed)
YOU HAD AN ENDOSCOPIC PROCEDURE TODAY AT THE [facility]:   Refer to the procedure report that was given to you for any specific questions about what was found during the examination.  If the procedure report does not answer your questions, please call your gastroenterologist to clarify.  If you requested that your care partner not be given the details of your procedure findings, then the procedure report has been included in a sealed envelope for you to review at your convenience later.  YOU SHOULD EXPECT: Some feelings of bloating in the abdomen. Passage of more gas than usual.  Walking can help get rid of the air that was put into your GI tract during the procedure and reduce the bloating. If you had a lower endoscopy (such as a colonoscopy or flexible sigmoidoscopy) you may notice spotting of blood in your stool or on the toilet paper. If you underwent a bowel prep for your procedure, you may not have a normal bowel movement for a few days.  Please Note:  You might notice some irritation and congestion in your nose or some drainage.  This is from the oxygen used during your procedure.  There is no need for concern and it should clear up in a day or so.  SYMPTOMS TO REPORT IMMEDIATELY:   Following lower endoscopy (colonoscopy or flexible sigmoidoscopy):  Excessive amounts of blood in the stool  Significant tenderness or worsening of abdominal pains  Swelling of the abdomen that is new, acute  Fever of 100F or higher   For urgent or emergent issues, a gastroenterologist can be reached at any hour by calling [phone]. Do not use MyChart messaging for urgent concerns.    DIET:  We do recommend a small meal at first, but then you may proceed to your regular diet.  Drink plenty of fluids but you should avoid alcoholic beverages for 24 hours.  MEDICATIONS: Continue present medications.  Please see handouts given to you by your recovery nurse.  ACTIVITY:  You should plan to  take it easy for the rest of today and you should NOT DRIVE or use heavy machinery until tomorrow (because of the sedation medicines used during the test).    FOLLOW UP: Our staff will call the number listed on your records 48-72 hours following your procedure to check on you and address any questions or concerns that you may have regarding the information given to you following your procedure. If we do not reach you, we will leave a message.  We will attempt to reach you two times.  During this call, we will ask if you have developed any symptoms of COVID 19. If you develop any symptoms (ie: fever, flu-like symptoms, shortness of breath, cough etc.) before then, please call [phone].  If you test positive for Covid 19 in the 2 weeks post procedure, please call and report this information to us.    If any biopsies were taken you will be contacted by phone or by letter within the next 1-3 weeks.  Please call us at [phone] if you have not heard about the biopsies in 3 weeks.   Thank you for allowing us to provide for your healthcare needs today.   SIGNATURES/CONFIDENTIALITY: You and/or your care partner have signed paperwork which will be entered into your electronic medical record.  These signatures attest to the fact that that the information above on your After Visit Summary has been reviewed and is understood.  Full responsibility of the   confidentiality of this discharge information lies with you and/or your care-partner. 

## 2019-08-09 NOTE — Op Note (Addendum)
[place] Patient Name: Raven Gomez Procedure Date: 08/09/2019 12:09 PM MRN: [id number] Endoscopist: Mallie Mussel L. Loletha Carrow , MD Age: 49 Referring MD:  Date of Birth: August 12, 1970 Gender: Female Account #: [id number] Procedure:                Colonoscopy Indications:              Screening for colorectal malignant neoplasm, This                            is the patient's first colonoscopy Medicines:                Monitored Anesthesia Care Procedure:                Pre-Anesthesia Assessment:                           - Prior to the procedure, a History and Physical                            was performed, and patient medications and                            allergies were reviewed. The patient's tolerance of                            previous anesthesia was also reviewed. The risks                            and benefits of the procedure and the sedation                            options and risks were discussed with the patient.                            All questions were answered, and informed consent                            was obtained. Prior Anticoagulants: The patient has                            taken no previous anticoagulant or antiplatelet                            agents. ASA Grade Assessment: I - A normal, healthy                            patient. After reviewing the risks and benefits,                            the patient was deemed in satisfactory condition to                            undergo the procedure.  After obtaining informed consent, the colonoscope                            was passed under direct vision. Throughout the                            procedure, the patient's blood pressure, pulse, and                            oxygen saturations were monitored continuously. The                            Colonoscope was introduced through the anus and                            advanced to the the cecum, identified  by                            appendiceal orifice and ileocecal valve. The                            colonoscopy was performed without difficulty. The                            patient tolerated the procedure well. The quality                            of the bowel preparation was good after lavage. The                            ileocecal valve, appendiceal orifice, and rectum                            were photographed. The bowel preparation used was                            Miralax. Scope In: 12:21:00 PM Scope Out: 12:40:45 PM Scope Withdrawal Time: 0 hours 13 minutes 17 seconds  Total Procedure Duration: 0 hours 19 minutes 45 seconds  Findings:                 The perianal and digital rectal examinations were                            normal.                           A diminutive polyp was found in the cecum. The                            polyp was flat. The polyp was removed with a cold                            biopsy forceps. Resection and retrieval were  complete.                           Two sessile polyps were found in the transverse                            colon and cecum. The polyps were diminutive in                            size. These polyps were removed with a cold snare.                            Resection and retrieval were complete.                           The exam was otherwise without abnormality on                            direct and retroflexion views. Complications:            No immediate complications. Estimated Blood Loss:     Estimated blood loss was minimal. Impression:               - One diminutive polyp in the cecum, removed with a                            cold biopsy forceps. Resected and retrieved.                           - Two diminutive polyps in the transverse colon and                            in the cecum, removed with a cold snare. Resected                            and retrieved.                            - The examination was otherwise normal on direct                            and retroflexion views. Recommendation:           - Patient has a contact number available for                            emergencies. The signs and symptoms of potential                            delayed complications were discussed with the                            patient. Return to normal activities tomorrow.                            Written discharge  instructions were provided to the                            patient.                           - Resume previous diet.                           - Continue present medications.                           - Await pathology results.                           - Repeat colonoscopy is recommended for                            surveillance. The colonoscopy date will be                            determined after pathology results from today's                            exam become available for review. (Suprep or Plenvu                            for next exam) Somara Frymire L. Loletha Carrow, MD 08/09/2019 12:44:21 PM This report has been signed electronically.

## 2019-08-09 NOTE — Progress Notes (Signed)
PT taken to PACU. Monitors in place. VSS. Report given to RN. 

## 2019-08-14 ENCOUNTER — Telehealth: Payer: Self-pay

## 2019-08-14 NOTE — Telephone Encounter (Signed)
Attempted to reach patient for post-procedure f/u call. No answer. Left message for her to please not hesitate to call us if she has any questions/concerns regarding her care. 

## 2019-08-14 NOTE — Telephone Encounter (Signed)
First attempt follow up call to pt, lm on vm 

## 2019-08-16 ENCOUNTER — Encounter: Payer: Self-pay | Admitting: Gastroenterology

## 2019-09-14 ENCOUNTER — Other Ambulatory Visit: Payer: Self-pay | Admitting: Family Medicine

## 2019-09-14 NOTE — Telephone Encounter (Signed)
Pt requesting Adderall 20mg  tab  LOV: 07/04/2019 Future Visits: 10/01/2019 Last refill: 08/08/2019  Approve?

## 2019-09-17 MED ORDER — AMPHETAMINE-DEXTROAMPHETAMINE 20 MG PO TABS: ORAL_TABLET | ORAL | 0 refills | Status: DC

## 2019-10-01 ENCOUNTER — Ambulatory Visit (INDEPENDENT_AMBULATORY_CARE_PROVIDER_SITE_OTHER): Payer: BC Managed Care – PPO | Admitting: Family Medicine

## 2019-10-01 ENCOUNTER — Other Ambulatory Visit: Payer: Self-pay

## 2019-10-01 ENCOUNTER — Encounter: Payer: Self-pay | Admitting: Family Medicine

## 2019-10-01 VITALS — BP 118/82 | HR 91 | Temp 98.3°F | Ht 59.0 in | Wt 129.8 lb

## 2019-10-01 DIAGNOSIS — F988 Other specified behavioral and emotional disorders with onset usually occurring in childhood and adolescence: Secondary | ICD-10-CM | POA: Diagnosis not present

## 2019-10-01 DIAGNOSIS — Z1152 Encounter for screening for COVID-19: Secondary | ICD-10-CM

## 2019-10-01 DIAGNOSIS — Z1159 Encounter for screening for other viral diseases: Secondary | ICD-10-CM

## 2019-10-01 MED ORDER — VALACYCLOVIR HCL 1 G PO TABS: ORAL_TABLET | ORAL | 0 refills | Status: DC

## 2019-10-01 NOTE — Progress Notes (Signed)
Patient: Raven Gomez MRN: [id number] DOB: August 24, 1970 PCP: Orma Flaming, MD     Subjective:  Chief Complaint  Patient presents with   ADHD    3 month f/u    HPI: The patient is a 49 y.o. female who presents today for routine ADD follow up. Her routine 3 month check up. We stopped the XR prescription at her last visit and changed her over to 20mg  IR. She takes this once in the AM and very rarely will do 1/2 tab in the afternoon if she is working late. She takes as prescribed and has no side effects. Last filled 09/17/19.   Getting a fever blister needs refill of her valtrex.   Would like ab test for covid   Review of Systems  Constitutional: Negative for chills, fatigue and fever.  HENT: Negative for dental problem, ear pain, hearing loss and trouble swallowing.   Eyes: Negative for visual disturbance.  Respiratory: Negative for cough, chest tightness and shortness of breath.   Cardiovascular: Negative for chest pain, palpitations and leg swelling.  Gastrointestinal: Negative for abdominal pain, blood in stool, diarrhea and nausea.  Endocrine: Negative for cold intolerance, polydipsia, polyphagia and polyuria.  Genitourinary: Negative for dysuria and hematuria.  Musculoskeletal: Negative for arthralgias.  Skin: Negative for rash.  Neurological: Negative for dizziness and headaches.  Psychiatric/Behavioral: Negative for dysphoric mood and sleep disturbance. The patient is not nervous/anxious.     Allergies Patient has No Known Allergies.  Past Medical History Patient  has a past medical history of ADHD, Allergy, and UTI (urinary tract infection).  Surgical History Patient  has a past surgical history that includes Wisdom tooth extraction (2008); Cesarean section; Cholecystectomy (N/A, 08/28/2017); and ORIF toe fracture (Right, 12/11/2018).  Family History Pateint's family history includes Arthritis in her mother; Breast cancer (age of onset: 3) in her mother; COPD in  her father; Cancer in her father, maternal grandmother, and mother; Depression in her brother, maternal grandmother, and mother; Diabetes in her maternal grandmother; Heart attack in her father; Heart disease in her paternal grandfather; Hypertension in her father, mother, and paternal grandfather; Mental illness in her brother and maternal grandmother; Stroke in her maternal grandfather and mother.  Social History Patient  reports that she has never smoked. She has never used smokeless tobacco. She reports current alcohol use. She reports that she does not use drugs.    Objective: Vitals:   10/01/19 0957  BP: 118/82  Pulse: 91  Temp: 98.3 F (36.8 C)  TempSrc: Temporal  SpO2: 100%  Weight: 129 lb 12.8 oz (58.9 kg)  Height: 4\' 11"  (1.499 m)    Body mass index is 26.22 kg/m.  Physical Exam Vitals reviewed.  Constitutional:      Appearance: Normal appearance. She is well-developed and normal weight.  HENT:     Head: Normocephalic and atraumatic.     Right Ear: External ear normal.     Left Ear: External ear normal.  Eyes:     Conjunctiva/sclera: Conjunctivae normal.     Pupils: Pupils are equal, round, and reactive to light.  Neck:     Thyroid: No thyromegaly.  Cardiovascular:     Rate and Rhythm: Normal rate and regular rhythm.     Heart sounds: Normal heart sounds. No murmur heard.   Pulmonary:     Effort: Pulmonary effort is normal.     Breath sounds: Normal breath sounds.  Abdominal:     General: Bowel sounds are normal. There is no distension.  Palpations: Abdomen is soft.     Tenderness: There is no abdominal tenderness.  Musculoskeletal:     Cervical back: Normal range of motion and neck supple.  Lymphadenopathy:     Cervical: No cervical adenopathy.  Skin:    General: Skin is warm and dry.     Capillary Refill: Capillary refill takes less than 2 seconds.     Findings: No rash.  Neurological:     General: No focal deficit present.     Mental Status: She  is alert and oriented to person, place, and time.     Cranial Nerves: No cranial nerve deficit.     Coordination: Coordination normal.     Deep Tendon Reflexes: Reflexes normal.  Psychiatric:        Mood and Affect: Mood normal.        Behavior: Behavior normal.        Assessment/plan: 1. Attention deficit disorder (ADD) without hyperactivity Doing well on new dosing of her medication. No refills needed. PMP website reviewed and she is filling correctly. F/u in 3 months for routine visit.   2. Fever blister -refilled valtrex  3. covid screening -ab  4. Hep c screen  This visit occurred during the SARS-CoV-2 public health emergency.  Safety protocols were in place, including screening questions prior to the visit, additional usage of staff PPE, and extensive cleaning of exam room while observing appropriate contact time as indicated for disinfecting solutions.    Return in about 3 months (around 01/01/2020) for routine ADD.     Orma Flaming, MD [place]   10/01/2019

## 2019-10-02 LAB — SARS COV-2 SEROLOGY(COVID-19)AB(IGG,IGM),IMMUNOASSAY
SARS CoV-2 AB IgG: NEGATIVE
SARS CoV-2 IgM: NEGATIVE

## 2019-10-02 LAB — HEPATITIS C ANTIBODY
Hepatitis C Ab: NONREACTIVE
SIGNAL TO CUT-OFF: 0 (ref <1.00)

## 2019-10-09 ENCOUNTER — Encounter: Payer: Self-pay | Admitting: Family Medicine

## 2019-10-29 ENCOUNTER — Encounter: Payer: Self-pay | Admitting: Family Medicine

## 2019-10-29 MED ORDER — AMPHETAMINE-DEXTROAMPHETAMINE 20 MG PO TABS: ORAL_TABLET | ORAL | 0 refills | Status: DC

## 2019-10-29 NOTE — Telephone Encounter (Signed)
Last written 09-17-19 #45 Last OV 23-21 Next OV 12-31-19 [facility] [address]

## 2019-11-06 DIAGNOSIS — N951 Menopausal and female climacteric states: Secondary | ICD-10-CM | POA: Diagnosis not present

## 2019-11-08 DIAGNOSIS — F4321 Adjustment disorder with depressed mood: Secondary | ICD-10-CM | POA: Diagnosis not present

## 2019-11-08 DIAGNOSIS — N951 Menopausal and female climacteric states: Secondary | ICD-10-CM | POA: Diagnosis not present

## 2019-11-08 DIAGNOSIS — R232 Flushing: Secondary | ICD-10-CM | POA: Diagnosis not present

## 2019-11-08 DIAGNOSIS — Z6825 Body mass index (BMI) 25.0-25.9, adult: Secondary | ICD-10-CM | POA: Diagnosis not present

## 2019-11-29 ENCOUNTER — Other Ambulatory Visit: Payer: Self-pay | Admitting: Family Medicine

## 2019-11-29 MED ORDER — AMPHETAMINE-DEXTROAMPHETAMINE 20 MG PO TABS: ORAL_TABLET | ORAL | 0 refills | Status: DC

## 2019-11-29 NOTE — Telephone Encounter (Signed)
Last refill: 10/29/19 #45, 0 Last OV: 10/01/19 dx. ADD

## 2019-12-04 DIAGNOSIS — N951 Menopausal and female climacteric states: Secondary | ICD-10-CM | POA: Diagnosis not present

## 2019-12-04 DIAGNOSIS — E559 Vitamin D deficiency, unspecified: Secondary | ICD-10-CM | POA: Diagnosis not present

## 2019-12-04 DIAGNOSIS — E038 Other specified hypothyroidism: Secondary | ICD-10-CM | POA: Diagnosis not present

## 2019-12-06 DIAGNOSIS — R232 Flushing: Secondary | ICD-10-CM | POA: Diagnosis not present

## 2019-12-06 DIAGNOSIS — F4321 Adjustment disorder with depressed mood: Secondary | ICD-10-CM | POA: Diagnosis not present

## 2019-12-06 DIAGNOSIS — Z6825 Body mass index (BMI) 25.0-25.9, adult: Secondary | ICD-10-CM | POA: Diagnosis not present

## 2019-12-06 DIAGNOSIS — N951 Menopausal and female climacteric states: Secondary | ICD-10-CM | POA: Diagnosis not present

## 2019-12-14 DIAGNOSIS — L821 Other seborrheic keratosis: Secondary | ICD-10-CM | POA: Diagnosis not present

## 2019-12-14 DIAGNOSIS — D0461 Carcinoma in situ of skin of right upper limb, including shoulder: Secondary | ICD-10-CM | POA: Diagnosis not present

## 2019-12-14 DIAGNOSIS — L570 Actinic keratosis: Secondary | ICD-10-CM | POA: Diagnosis not present

## 2019-12-14 DIAGNOSIS — L812 Freckles: Secondary | ICD-10-CM | POA: Diagnosis not present

## 2019-12-23 IMAGING — RF DG CHOLANGIOGRAM OPERATIVE
1 series · 4 of 4 positions shown · non-contrast
Comparison: 08/27/2017

CLINICAL DATA: Cholelithiasis

EXAM:
INTRAOPERATIVE CHOLANGIOGRAM
TECHNIQUE: Cholangiographic images from the C-arm fluoroscopic device were
submitted for interpretation post-operatively. Please see the
procedural report for the amount of contrast and the fluoroscopy
time utilized.

[Series 1: run · 4 of 73 frames shown]
[frame 11/73]
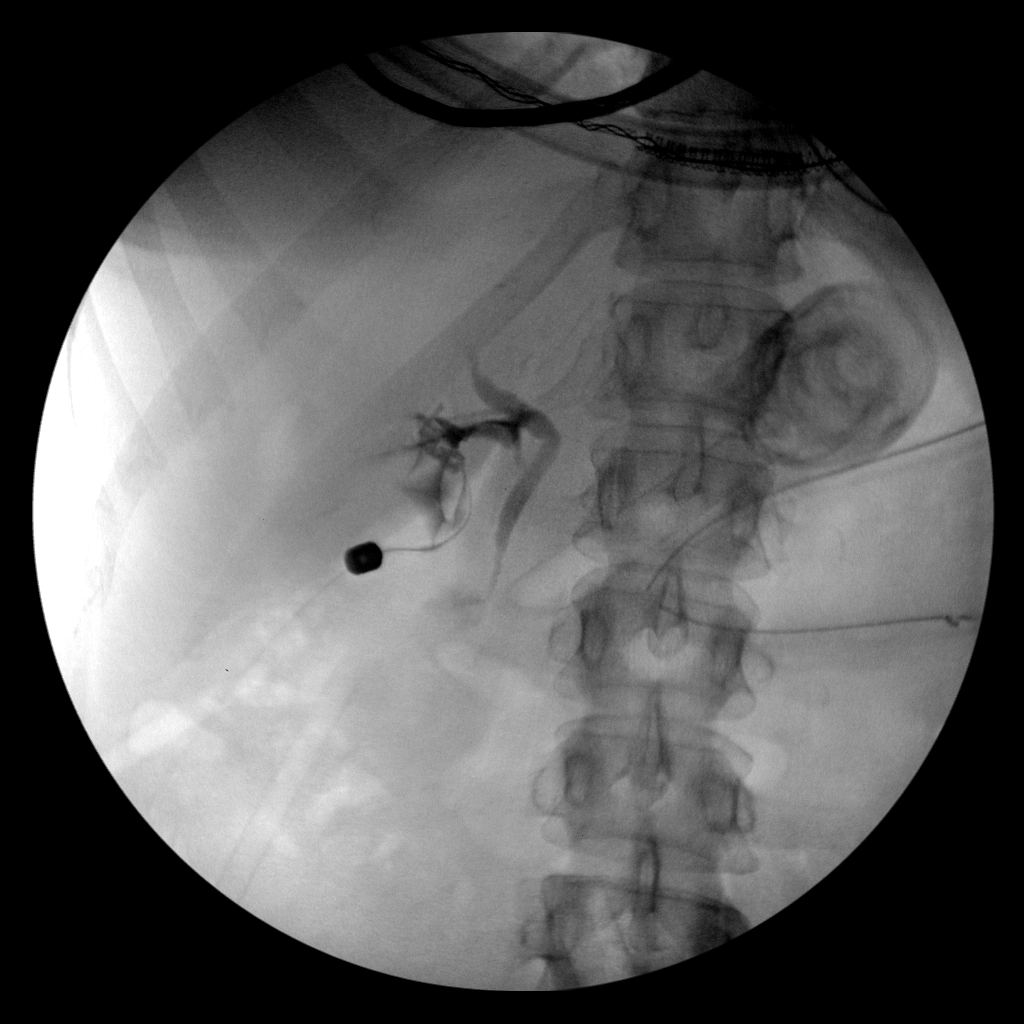
[frame 36/73]
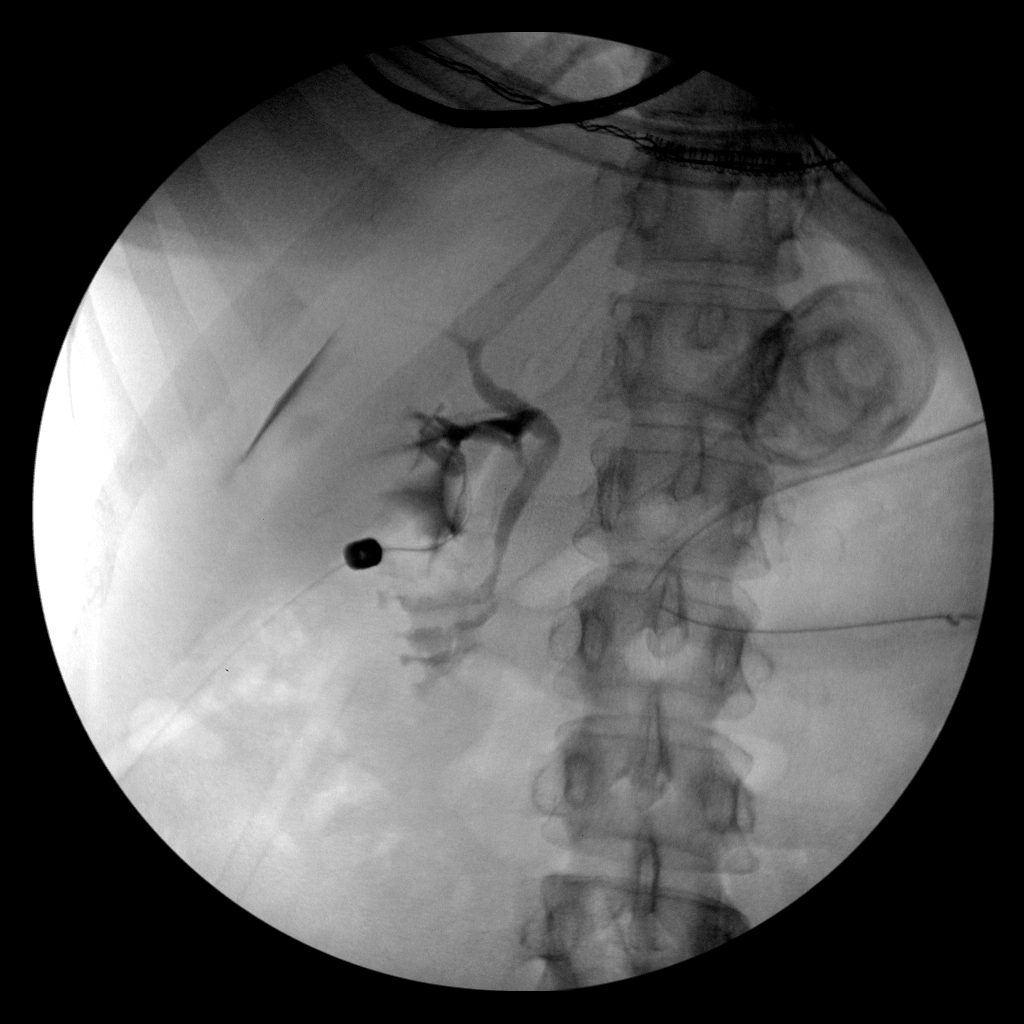
[frame 37/73]
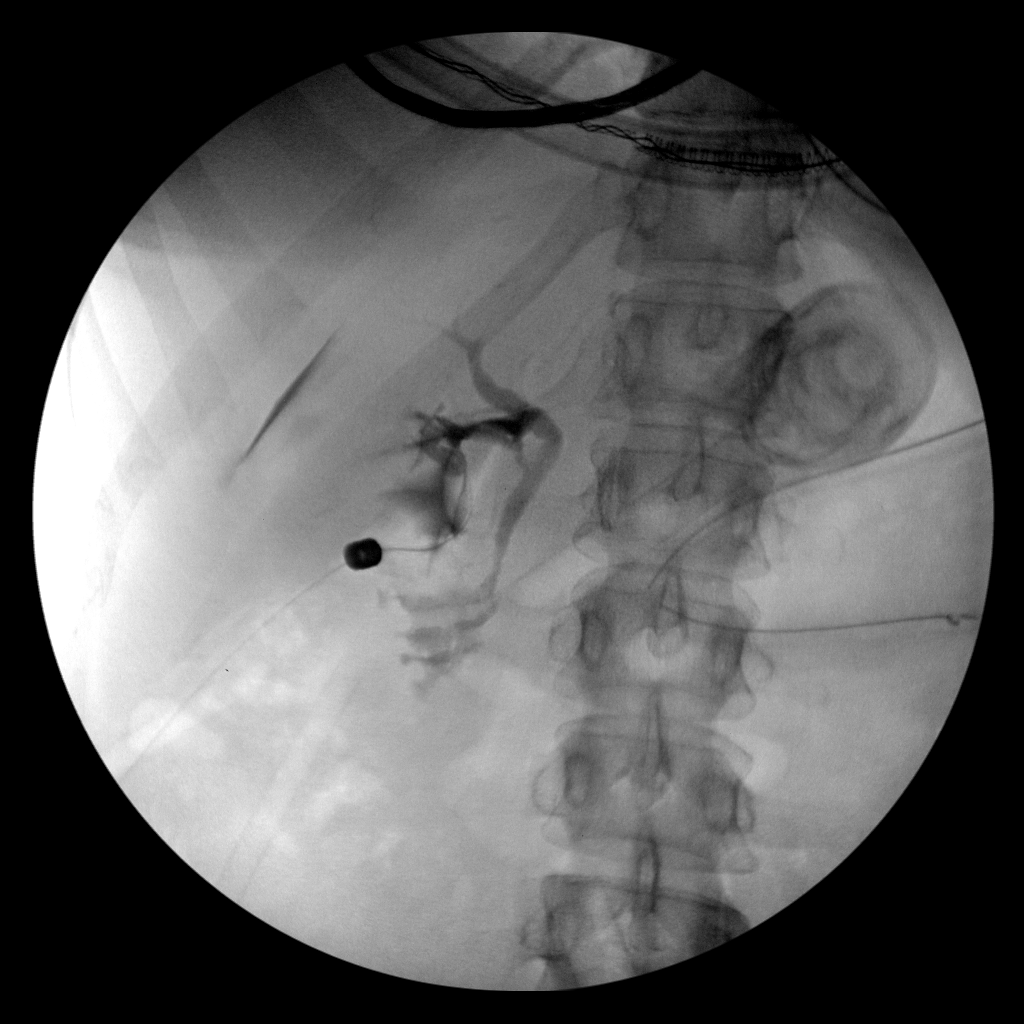
[frame 63/73]
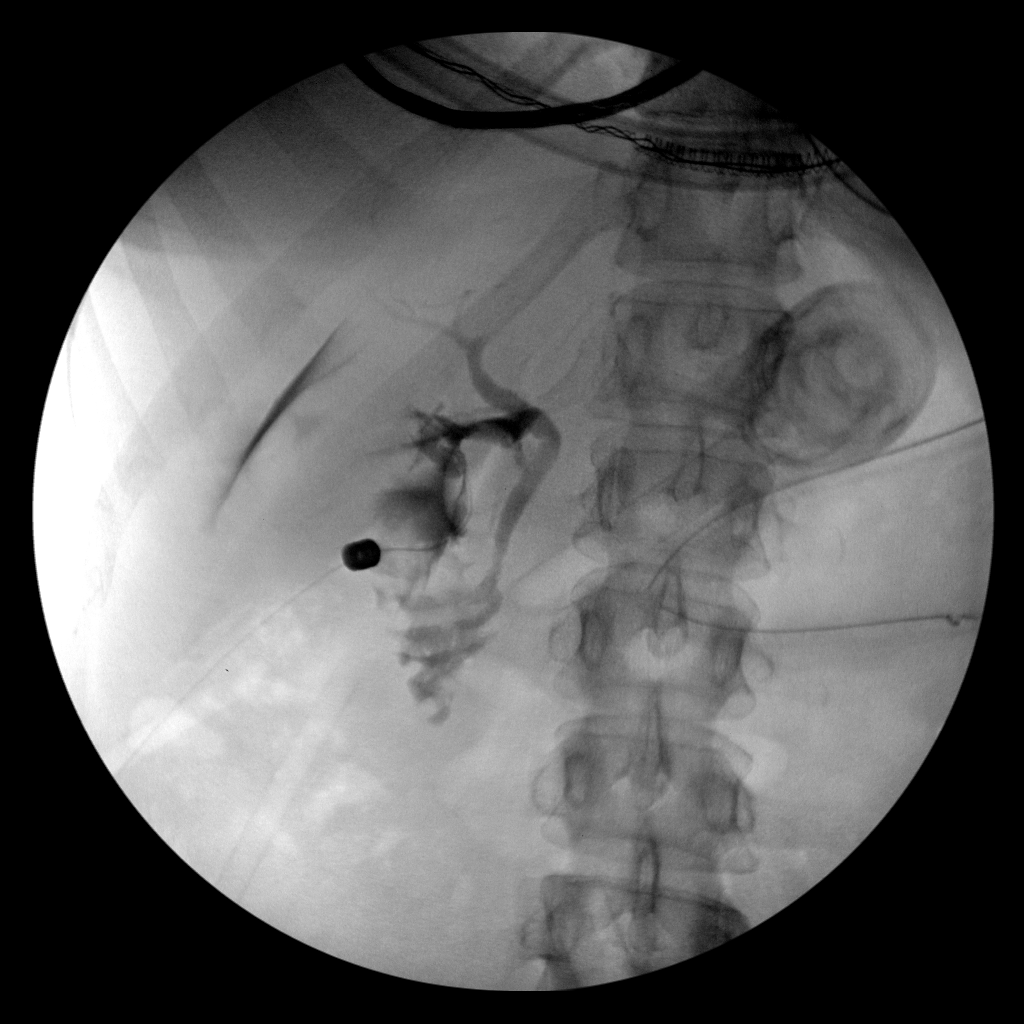

[4 of 4 positions shown; findings below may reference images not displayed]

FINDINGS: Intraoperative cholangiogram performed during the procedure.
Contrast extravasation at the cystic duct injection site. Common
hepatic duct and common bile duct are patent. Contrast drains easily
into the duodenum. No dilatation or obstruction. No stricture.
IMPRESSION: Patent biliary system.  Leakage at the injection site.

## 2019-12-24 ENCOUNTER — Other Ambulatory Visit: Payer: Self-pay

## 2019-12-24 ENCOUNTER — Encounter: Payer: Self-pay | Admitting: Family Medicine

## 2019-12-24 ENCOUNTER — Ambulatory Visit: Payer: BC Managed Care – PPO | Admitting: Family Medicine

## 2019-12-24 VITALS — BP 109/81 | HR 89 | Temp 97.5°F | Ht 59.0 in | Wt 130.8 lb

## 2019-12-24 DIAGNOSIS — F988 Other specified behavioral and emotional disorders with onset usually occurring in childhood and adolescence: Secondary | ICD-10-CM

## 2019-12-24 NOTE — Patient Instructions (Signed)
See you in 3 months! aw

## 2019-12-24 NOTE — Progress Notes (Signed)
Patient: Raven Gomez MRN: [id number] DOB: 11/22/1970 PCP: Orma Flaming, MD     Subjective:  Chief Complaint  Patient presents with  . ADD    HPI: The patient is a 49 y.o. female who presents today for ADD. She is here for routine 3 month follow up. Currently on  20mg  IR. She takes this once in the AM and very rarely will do 1/2 tab in the afternoon if she is working late. She takes as prescribed and has no side effects. Last filled 12/04/19. She is doing well with current medication. No desire to change current regimen. Takes as prescribed.   Review of Systems  Constitutional: Negative for chills, fatigue and fever.  HENT: Negative for dental problem, ear pain, hearing loss and trouble swallowing.   Eyes: Negative for visual disturbance.  Respiratory: Negative for cough, chest tightness and shortness of breath.   Cardiovascular: Negative for chest pain, palpitations and leg swelling.  Gastrointestinal: Negative for abdominal pain, blood in stool, diarrhea, nausea and vomiting.  Endocrine: Negative for cold intolerance, polydipsia, polyphagia and polyuria.  Genitourinary: Negative for dysuria and hematuria.  Musculoskeletal: Negative for arthralgias.  Skin: Negative for rash.  Neurological: Negative for dizziness and headaches.  Psychiatric/Behavioral: Negative for dysphoric mood and sleep disturbance. The patient is not nervous/anxious.     Allergies Patient has No Known Allergies.  Past Medical History Patient  has a past medical history of ADHD, Allergy, and UTI (urinary tract infection).  Surgical History Patient  has a past surgical history that includes Wisdom tooth extraction (2008); Cesarean section; Cholecystectomy (N/A, 08/28/2017); and ORIF toe fracture (Right, 12/11/2018).  Family History Pateint's family history includes Arthritis in her mother; Breast cancer (age of onset: 40) in her mother; COPD in her father; Cancer in her father, maternal grandmother, and  mother; Depression in her brother, maternal grandmother, and mother; Diabetes in her maternal grandmother; Heart attack in her father; Heart disease in her paternal grandfather; Hypertension in her father, mother, and paternal grandfather; Mental illness in her brother and maternal grandmother; Stroke in her maternal grandfather and mother.  Social History Patient  reports that she has never smoked. She has never used smokeless tobacco. She reports current alcohol use. She reports that she does not use drugs.    Objective: Vitals:   12/24/19 1025  BP: 109/81  Pulse: 89  Temp: (!) 97.5 F (36.4 C)  TempSrc: Temporal  SpO2: 100%  Weight: 130 lb 12.8 oz (59.3 kg)  Height: 4\' 11"  (1.499 m)    Body mass index is 26.42 kg/m.  Physical Exam Vitals reviewed.  Constitutional:      Appearance: Normal appearance. She is normal weight.  HENT:     Head: Normocephalic and atraumatic.  Cardiovascular:     Rate and Rhythm: Normal rate and regular rhythm.     Heart sounds: Normal heart sounds.  Pulmonary:     Effort: Pulmonary effort is normal.     Breath sounds: Normal breath sounds.  Abdominal:     General: Abdomen is flat. Bowel sounds are normal.     Palpations: Abdomen is soft.  Neurological:     General: No focal deficit present.     Mental Status: She is alert and oriented to person, place, and time.  Psychiatric:        Mood and Affect: Mood normal.        Behavior: Behavior normal.        Assessment/plan: 1. Attention deficit disorder, unspecified hyperactivity presence  Doing well. PMP website reviewed. Continue current dosing. No refills needed. F/u in 3 months or as needed.    This visit occurred during the SARS-CoV-2 public health emergency.  Safety protocols were in place, including screening questions prior to the visit, additional usage of staff PPE, and extensive cleaning of exam room while observing appropriate contact time as indicated for disinfecting solutions.      Return in about 3 months (around 03/25/2020) for adhd/annual exam/anxiety .    Orma Flaming, MD [place]   12/24/2019

## 2019-12-31 ENCOUNTER — Ambulatory Visit: Payer: BC Managed Care – PPO | Admitting: Family Medicine

## 2020-01-08 DIAGNOSIS — R87610 Atypical squamous cells of undetermined significance on cytologic smear of cervix (ASC-US): Secondary | ICD-10-CM | POA: Diagnosis not present

## 2020-01-08 DIAGNOSIS — Z6825 Body mass index (BMI) 25.0-25.9, adult: Secondary | ICD-10-CM | POA: Diagnosis not present

## 2020-01-08 DIAGNOSIS — Z01419 Encounter for gynecological examination (general) (routine) without abnormal findings: Secondary | ICD-10-CM | POA: Diagnosis not present

## 2020-01-08 DIAGNOSIS — Z1231 Encounter for screening mammogram for malignant neoplasm of breast: Secondary | ICD-10-CM | POA: Diagnosis not present

## 2020-01-08 DIAGNOSIS — Z7189 Other specified counseling: Secondary | ICD-10-CM | POA: Diagnosis not present

## 2020-01-08 LAB — HM MAMMOGRAPHY

## 2020-01-09 ENCOUNTER — Other Ambulatory Visit: Payer: Self-pay | Admitting: Family Medicine

## 2020-01-09 MED ORDER — AMPHETAMINE-DEXTROAMPHETAMINE 20 MG PO TABS
ORAL_TABLET | ORAL | 0 refills | Status: DC
Start: 2020-01-09 — End: 2020-02-19

## 2020-01-09 NOTE — Telephone Encounter (Signed)
LR: 11-29-2019 Qty: 74 with 0 refills  Last office visit: 12-24-2019 Upcoming appointment: 03-31-2020

## 2020-02-04 ENCOUNTER — Telehealth (INDEPENDENT_AMBULATORY_CARE_PROVIDER_SITE_OTHER): Payer: BC Managed Care – PPO | Admitting: Family Medicine

## 2020-02-04 ENCOUNTER — Other Ambulatory Visit: Payer: Self-pay

## 2020-02-04 ENCOUNTER — Encounter: Payer: Self-pay | Admitting: Family Medicine

## 2020-02-04 VITALS — HR 77 | Ht 59.0 in | Wt 130.7 lb

## 2020-02-04 DIAGNOSIS — U071 COVID-19: Secondary | ICD-10-CM | POA: Diagnosis not present

## 2020-02-04 LAB — POCT INFLUENZA A/B
Influenza A, POC: NEGATIVE
Influenza B, POC: NEGATIVE

## 2020-02-04 NOTE — Progress Notes (Signed)
Patient: Raven Gomez MRN: [id number] DOB: [birth year] PCP: Orland Mustard, MD     I connected with Deovion Tross Okelley on 02/04/20 at 2:44pm by a video enabled telemedicine application and verified that I am speaking with the correct person using two identifiers.  Location patient: Home Location provider: [facility], Office Persons participating in this virtual visit: Islee Yenter   I discussed the limitations of evaluation and management by telemedicine and the availability of in person appointments. The patient expressed understanding and agreed to proceed.   Subjective:  Chief Complaint  Patient presents with  . Covid Positive    Symptoms started Friday. Took 2 at home test. Both were positive.    HPI: The patient is a 49 y.o. female who presents today for a positive Covid result. Symptoms started on Friday.  She woke up Friday morning with a bad head cold and headache. She had a fever Friday night to 102 and started to have a cough mid day Friday. She took 2 at home tests, that were both positive. (Friday and Sunday).  She has not had any fever since Friday night. She has been taking tylenol and advil together. She is not really achy, but has some aches in her lower back. Her symptoms are mainly in her head. She has no pulmonary issues. She has no copd/asthma. She denies any shortness of breath or wheezing. She states her nose is burning and she has somewhat lost her taste. She can't really figure out any sick contacts. She is not vaccinated.   She can come up today so I can swab her.   Review of Systems  HENT: Positive for postnasal drip, sinus pressure and sinus pain. Negative for congestion and sore throat.   Respiratory: Positive for cough. Negative for shortness of breath.   Neurological: Positive for headaches. Negative for dizziness.    Allergies Patient has No Known Allergies.  Past Medical History Patient  has a past medical history of ADHD, Allergy, and UTI  (urinary tract infection).  Surgical History Patient  has a past surgical history that includes Wisdom tooth extraction (2008); Cesarean section; Cholecystectomy (N/A, 08/28/2017); and ORIF toe fracture (Right, 12/11/2018).  Family History Pateint's family history includes Arthritis in her mother; Breast cancer (age of onset: 68) in her mother; COPD in her father; Cancer in her father, maternal grandmother, and mother; Depression in her brother, maternal grandmother, and mother; Diabetes in her maternal grandmother; Heart attack in her father; Heart disease in her paternal grandfather; Hypertension in her father, mother, and paternal grandfather; Mental illness in her brother and maternal grandmother; Stroke in her maternal grandfather and mother.  Social History Patient  reports that she has never smoked. She has never used smokeless tobacco. She reports current alcohol use. She reports that she does not use drugs.    Objective: Vitals:   02/04/20 1427  Pulse: 77  SpO2: 100%  Weight: 130 lb 11.7 oz (59.3 kg)  Height: 4\' 11"  (1.499 m)    Body mass index is 26.4 kg/m.  Physical Exam Vitals reviewed.  Constitutional:      General: She is not in acute distress.    Appearance: Normal appearance. She is not ill-appearing.  Pulmonary:     Effort: Pulmonary effort is normal.     Breath sounds: Normal breath sounds.  Neurological:     General: No focal deficit present.     Mental Status: She is alert and oriented to person, place, and time.  Psychiatric:  Mood and Affect: Mood normal.        Behavior: Behavior normal.    Flu negative    Assessment/plan: 1. COVID-19 -starting her on treatment nutraceutical bundle including zinc sulfate, vit D, vit C, quercetin and melatonin 5-15+ days depending on symptoms. She has already started this.  -iodine 1% nasal drop 1-2x/day -proza 30mg  daily x 10 days -ivermectin X5 days. Discussed with family that this is still considered  experimental; however, multiple RCT have been done that have shown significant benefit with it's use. They would like to proceed.  -no concerning respiratory symptoms at this time. Oxygen is 100%. Discussed if worsening respiratory will add on steroids/inhaler if needed.  -ASA 325mg  (no contraindication) x5-15 days -discussed hold all NSAIDs, would just do tylenol since on ASA. .  -gargle mouthwash TID -outside as much as possible.  -pulse ox >93-94% -prone sleeping if possible.  -strict ER precautions given and quarantine discussed.   - Novel Coronavirus, NAA ([facility]); Future - POCT Influenza A/B    Return if symptoms worsen or fail to improve.    , MD [place] [facility]  02/04/2020

## 2020-02-05 LAB — NOVEL CORONAVIRUS, NAA: SARS-CoV-2, NAA: DETECTED — AB

## 2020-02-05 LAB — SARS-COV-2, NAA 2 DAY TAT

## 2020-02-06 ENCOUNTER — Other Ambulatory Visit: Payer: Self-pay | Admitting: Family Medicine

## 2020-02-06 MED ORDER — DOXYCYCLINE HYCLATE 100 MG PO TABS: 100.0000 mg | ORAL_TABLET | Freq: Two times a day (BID) | ORAL | 0 refills | Status: DC

## 2020-02-06 MED ORDER — DOXYCYCLINE HYCLATE 100 MG PO TABS
100.0000 mg | ORAL_TABLET | Freq: Two times a day (BID) | ORAL | 0 refills | Status: DC
Start: 2020-02-06 — End: 2020-03-31

## 2020-02-19 ENCOUNTER — Encounter: Payer: Self-pay | Admitting: Family Medicine

## 2020-02-19 ENCOUNTER — Other Ambulatory Visit: Payer: Self-pay | Admitting: Family Medicine

## 2020-02-20 MED ORDER — AMPHETAMINE-DEXTROAMPHETAMINE 20 MG PO TABS: ORAL_TABLET | ORAL | 0 refills | Status: DC

## 2020-03-10 ENCOUNTER — Encounter: Payer: Self-pay | Admitting: Family Medicine

## 2020-03-31 ENCOUNTER — Encounter: Payer: Self-pay | Admitting: Family Medicine

## 2020-03-31 ENCOUNTER — Ambulatory Visit: Payer: BC Managed Care – PPO | Admitting: Family Medicine

## 2020-03-31 ENCOUNTER — Other Ambulatory Visit: Payer: Self-pay

## 2020-03-31 VITALS — BP 130/82 | HR 95 | Temp 98.0°F | Ht 59.0 in | Wt 126.6 lb

## 2020-03-31 DIAGNOSIS — F908 Attention-deficit hyperactivity disorder, other type: Secondary | ICD-10-CM

## 2020-03-31 DIAGNOSIS — Z0000 Encounter for general adult medical examination without abnormal findings: Secondary | ICD-10-CM

## 2020-03-31 LAB — CBC WITH DIFFERENTIAL/PLATELET
Basophils Absolute: 0 10*3/uL (ref 0.0–0.1)
Basophils Relative: 0.2 % (ref 0.0–3.0)
Eosinophils Absolute: 0 10*3/uL (ref 0.0–0.7)
Eosinophils Relative: 0.5 % (ref 0.0–5.0)
HCT: 40.3 % (ref 36.0–46.0)
Hemoglobin: 14 g/dL (ref 12.0–15.0)
Lymphocytes Relative: 24.4 % (ref 12.0–46.0)
Lymphs Abs: 1 10*3/uL (ref 0.7–4.0)
MCHC: 34.6 g/dL (ref 30.0–36.0)
MCV: 97.7 fl (ref 78.0–100.0)
Monocytes Absolute: 0.4 10*3/uL (ref 0.1–1.0)
Monocytes Relative: 10 % (ref 3.0–12.0)
Neutro Abs: 2.6 10*3/uL (ref 1.4–7.7)
Neutrophils Relative %: 64.9 % (ref 43.0–77.0)
Platelets: 222 10*3/uL (ref 150.0–400.0)
RBC: 4.12 Mil/uL (ref 3.87–5.11)
RDW: 13.4 % (ref 11.5–15.5)
WBC: 4 10*3/uL (ref 4.0–10.5)

## 2020-03-31 LAB — LIPID PANEL
Cholesterol: 179 mg/dL (ref 0–200)
HDL: 91.4 mg/dL (ref >39.00)
LDL Cholesterol: 75 mg/dL (ref 0–99)
NonHDL: 88.08
Total CHOL/HDL Ratio: 2
Triglycerides: 64 mg/dL (ref 0.0–149.0)
VLDL: 12.8 mg/dL (ref 0.0–40.0)

## 2020-03-31 LAB — COMPREHENSIVE METABOLIC PANEL
ALT: 13 U/L (ref 0–35)
AST: 17 U/L (ref 0–37)
Albumin: 4.3 g/dL (ref 3.5–5.2)
Alkaline Phosphatase: 51 U/L (ref 39–117)
BUN: 12 mg/dL (ref 6–23)
CO2: 26 mEq/L (ref 19–32)
Calcium: 9.4 mg/dL (ref 8.4–10.5)
Chloride: 103 mEq/L (ref 96–112)
Creatinine, Ser: 0.63 mg/dL (ref 0.40–1.20)
GFR: 102.93 mL/min (ref >60.00)
Glucose, Bld: 101 mg/dL — ABNORMAL HIGH (ref 70–99)
Potassium: 4.6 mEq/L (ref 3.5–5.1)
Sodium: 138 mEq/L (ref 135–145)
Total Bilirubin: 0.7 mg/dL (ref 0.2–1.2)
Total Protein: 7.1 g/dL (ref 6.0–8.3)

## 2020-03-31 LAB — TSH: TSH: 1.86 u[IU]/mL (ref 0.35–4.50)

## 2020-03-31 MED ORDER — AMPHETAMINE-DEXTROAMPHETAMINE 20 MG PO TABS: ORAL_TABLET | ORAL | 0 refills | Status: DC

## 2020-03-31 NOTE — Progress Notes (Signed)
Patient: Raven Gomez MRN: [id number] DOB: [birth year] PCP: Orma Flaming, MD     Subjective:  Chief Complaint  Patient presents with  . Annual Exam  . ADHD    HPI: The patient is a 50 y.o. female who presents today for annual exam. She denies any changes to past medical history. There have been no recent hospitalizations. They are not following a well balanced diet and exercise plan. Weight has been stable. Pt complains of sinus pressure and burning after Covid. She states still being foggy headed. She uses flonase, and Allegra D, but still has concerns.  ADD Currently on adderall 30m daily and 1/2 in pm as needed. She takes as prescribed. PMP website checked and fills correctly. No side effects and no issues. Needing refill today.    Immunization History  Administered Date(s) Administered  . DTaP 04/23/1969, 05/30/1969, 07/01/1969, 08/05/1969  . IPV 05/30/1969, 08/05/1969, 10/07/1969, 09/09/1970  . MMR 03/07/1970   Colonoscopy: 08/09/2019. F/u in 5 years.  Mammogram: 01/08/2020 Pap smear: 04/08/2017   Review of Systems  Constitutional: Negative for chills, fatigue and fever.  HENT: Positive for sinus pressure. Negative for dental problem, ear pain, hearing loss and trouble swallowing.   Eyes: Negative for visual disturbance.  Respiratory: Negative for cough, chest tightness and shortness of breath.   Cardiovascular: Negative for chest pain, palpitations and leg swelling.  Gastrointestinal: Negative for abdominal pain, blood in stool, diarrhea and nausea.  Endocrine: Negative for cold intolerance, polydipsia, polyphagia and polyuria.  Genitourinary: Negative for dysuria and hematuria.  Musculoskeletal: Negative for arthralgias.  Skin: Negative for rash.  Neurological: Negative for dizziness and headaches.  Psychiatric/Behavioral: Negative for dysphoric mood and sleep disturbance. The patient is not nervous/anxious.     Allergies Patient has No Known Allergies.  Past  Medical History Patient  has a past medical history of ADHD, Allergy, and UTI (urinary tract infection).  Surgical History Patient  has a past surgical history that includes Wisdom tooth extraction (2008); Cesarean section; Cholecystectomy (N/A, 08/28/2017); and ORIF toe fracture (Right, 12/11/2018).  Family History Pateint's family history includes Arthritis in her mother; Breast cancer (age of onset: 662 in her mother; COPD in her father; Cancer in her father, maternal grandmother, and mother; Depression in her brother, maternal grandmother, and mother; Diabetes in her maternal grandmother; Heart attack in her father; Heart disease in her paternal grandfather; Hypertension in her father, mother, and paternal grandfather; Mental illness in her brother and maternal grandmother; Stroke in her maternal grandfather and mother.  Social History Patient  reports that she has never smoked. She has never used smokeless tobacco. She reports current alcohol use. She reports that she does not use drugs.    Objective: Vitals:   03/31/20 0931  BP: 130/82  Pulse: 95  Temp: 98 F (36.7 C)  TempSrc: Temporal  SpO2: 99%  Weight: 126 lb 9.6 oz (57.4 kg)  Height: 4' 11"  (1.499 m)    Body mass index is 25.57 kg/m.  Physical Exam Vitals reviewed.  Constitutional:      Appearance: Normal appearance. She is well-developed, normal weight and well-nourished.  HENT:     Head: Normocephalic and atraumatic.     Right Ear: Tympanic membrane, ear canal and external ear normal.     Left Ear: Tympanic membrane, ear canal and external ear normal.     Nose: Nose normal.     Mouth/Throat:     Mouth: Oropharynx is clear and moist. Mucous membranes are moist.  Eyes:  Extraocular Movements: Extraocular movements intact and EOM normal.     Conjunctiva/sclera: Conjunctivae normal.     Pupils: Pupils are equal, round, and reactive to light.  Neck:     Thyroid: No thyromegaly.  Cardiovascular:     Rate and  Rhythm: Normal rate and regular rhythm.     Pulses: Normal pulses and intact distal pulses.     Heart sounds: Normal heart sounds. No murmur heard.   Pulmonary:     Effort: Pulmonary effort is normal.     Breath sounds: Normal breath sounds.  Abdominal:     General: Abdomen is flat. Bowel sounds are normal. There is no distension.     Palpations: Abdomen is soft.     Tenderness: There is no abdominal tenderness.  Musculoskeletal:     Cervical back: Normal range of motion and neck supple.  Lymphadenopathy:     Cervical: No cervical adenopathy.  Skin:    General: Skin is warm and dry.     Capillary Refill: Capillary refill takes less than 2 seconds.     Findings: No rash.  Neurological:     General: No focal deficit present.     Mental Status: She is alert and oriented to person, place, and time.     Cranial Nerves: No cranial nerve deficit.     Coordination: Coordination normal.     Deep Tendon Reflexes: Reflexes normal.  Psychiatric:        Mood and Affect: Mood and affect and mood normal.        Behavior: Behavior normal.      [place] Office Visit from 03/31/2020 in [place]  PHQ-2 Total Score 0         Assessment/plan: 1. Annual physical exam Routine fasting labs today. HM reviewed. She is up to date and will return for pap smear when due. Continue healthy lifestyle encouraged exercise. Discussed covid long hauler as well. Would have her do ivermectin bi-weekly x 4 weeks to help with some of her long haul symptoms. Do not think it will help with her smell issues and discussed this may last 6 months to a year;however, some people to continue to have intermittent issues.  Patient counseling [x]    Nutrition: Stressed importance of moderation in sodium/caffeine intake, saturated fat and cholesterol, caloric balance, sufficient intake of fresh fruits, vegetables, fiber, calcium, iron, and 1 mg of folate supplement per day (for females capable of  pregnancy).  [x]    Stressed the importance of regular exercise.   []    Substance Abuse: Discussed cessation/primary prevention of tobacco, alcohol, or other drug use; driving or other dangerous activities under the influence; availability of treatment for abuse.   [x]    Injury prevention: Discussed safety belts, safety helmets, smoke detector, smoking near bedding or upholstery.   [x]    Sexuality: Discussed sexually transmitted diseases, partner selection, use of condoms, avoidance of unintended pregnancy  and contraceptive alternatives.  [x]    Dental health: Discussed importance of regular tooth brushing, flossing, and dental visits.  [x]    Health maintenance and immunizations reviewed. Please refer to Health maintenance section.    - CBC with Differential/Platelet - Comprehensive metabolic panel - Lipid panel - TSH  2. Attention deficit hyperactivity disorder (ADHD), other type PMP website verified and taking as prescribed. Refills given today. UDS done today. F/u in 3 months time for routine add visit.      This visit occurred during the SARS-CoV-2 public health emergency.  Safety protocols were  in place, including screening questions prior to the visit, additional usage of staff PPE, and extensive cleaning of exam room while observing appropriate contact time as indicated for disinfecting solutions.     Return in about 3 months (around 06/28/2020) for add routine f/u .     Orma Flaming, MD [place]  03/31/2020

## 2020-03-31 NOTE — Patient Instructions (Addendum)
D3: 2000 IU/day Zinc: 41m/day Quercetin 2555mday C: [id number] Melatonin 5mg85mghtly   Ivermectin start back up at .4mg/69mtwice a week. (do your treatment dosage) you need 23mg 93me a week. I would do this for a month and see how you are feeling.   Preventive Care 40-64 5a12 Old, Female Preventive care refers to lifestyle choices and visits with your health care provider that can promote health and wellness. This includes:  A yearly physical exam. This is also called an annual wellness visit.  Regular dental and eye exams.  Immunizations.  Screening for certain conditions.  Healthy lifestyle choices, such as: ? Eating a healthy diet. ? Getting regular exercise. ? Not using drugs or products that contain nicotine and tobacco. ? Limiting alcohol use. What can I expect for my preventive care visit? Physical exam Your health care provider will check your:  Height and weight. These may be used to calculate your BMI (body mass index). BMI is a measurement that tells if you are at a healthy weight.  Heart rate and blood pressure.  Body temperature.  Skin for abnormal spots. Counseling Your health care provider may ask you questions about your:  Past medical problems.  Family's medical history.  Alcohol, tobacco, and drug use.  Emotional well-being.  Home life and relationship well-being.  Sexual activity.  Diet, exercise, and sleep habits.  Work and work enviroStatisticianess to firearms.  Method of birth control.  Menstrual cycle.  Pregnancy history. What immunizations do I need? Vaccines are usually given at various ages, according to a schedule. Your health care provider will recommend vaccines for you based on your age, medical history, and lifestyle or other factors, such as travel or where you work.   What tests do I need? Blood tests  Lipid and cholesterol levels. These may be checked every 5 years, or more often if you are over 50 yea46  old.  Hepatitis C test.  Hepatitis B test. Screening  Lung cancer screening. You may have this screening every year starting at age 57 if 73u have a 30-pack-year history of smoking and currently smoke or have quit within the past 15 years.  Colorectal cancer screening. ? All adults should have this screening starting at age 48 and5ontinuing until age 27. ? 88ur health care provider may recommend screening at age 87 if 59u are at increased risk. ? You will have tests every 1-10 years, depending on your results and the type of screening test.  Diabetes screening. ? This is done by checking your blood sugar (glucose) after you have not eaten for a while (fasting). ? You may have this done every 1-3 years.  Mammogram. ? This may be done every 1-2 years. ? Talk with your health care provider about when you should start having regular mammograms. This may depend on whether you have a family history of breast cancer.  BRCA-related cancer screening. This may be done if you have a family history of breast, ovarian, tubal, or peritoneal cancers.  Pelvic exam and Pap test. ? This may be done every 3 years starting at age 68. ? 66arting at age 48, th74 may be done every 5 years if you have a Pap test in combination with an HPV test. Other tests  STD (sexually transmitted disease) testing, if you are at risk.  Bone density scan. This is done to screen for osteoporosis. You may have this scan if you are at high risk for osteoporosis. Talk with your health care  provider about your test results, treatment options, and if necessary, the need for more tests. Follow these instructions at home: Eating and drinking  Eat a diet that includes fresh fruits and vegetables, whole grains, lean protein, and low-fat dairy products.  Take vitamin and mineral supplements as recommended by your health care provider.  Do not drink alcohol if: ? Your health care provider tells you not to drink. ? You are  pregnant, may be pregnant, or are planning to become pregnant.  If you drink alcohol: ? Limit how much you have to 0-1 drink a day. ? Be aware of how much alcohol is in your drink. In the [place] one drink equals one 12 oz bottle of beer (355 mL), one 5 oz glass of wine (148 mL), or one 1 oz glass of hard liquor (44 mL).   Lifestyle  Take daily care of your teeth and gums. Brush your teeth every morning and night with fluoride toothpaste. Floss one time each day.  Stay active. Exercise for at least 30 minutes 5 or more days each week.  Do not use any products that contain nicotine or tobacco, such as cigarettes, e-cigarettes, and chewing tobacco. If you need help quitting, ask your health care provider.  Do not use drugs.  If you are sexually active, practice safe sex. Use a condom or other form of protection to prevent STIs (sexually transmitted infections).  If you do not wish to become pregnant, use a form of birth control. If you plan to become pregnant, see your health care provider for a prepregnancy visit.  If told by your health care provider, take low-dose aspirin daily starting at age 87.  Find healthy ways to cope with stress, such as: ? Meditation, yoga, or listening to music. ? Journaling. ? Talking to a trusted person. ? Spending time with friends and family. Safety  Always wear your seat belt while driving or riding in a vehicle.  Do not drive: ? If you have been drinking alcohol. Do not ride with someone who has been drinking. ? When you are tired or distracted. ? While texting.  Wear a helmet and other protective equipment during sports activities.  If you have firearms in your house, make sure you follow all gun safety procedures. What's next?  Visit your health care provider once a year for an annual wellness visit.  Ask your health care provider how often you should have your eyes and teeth checked.  Stay up to date on all vaccines. This information is  not intended to replace advice given to you by your health care provider. Make sure you discuss any questions you have with your health care provider. Document Revised: 10/30/2019 Document Reviewed: 10/06/2017 Elsevier Patient Education  2021 [employer].

## 2020-04-01 LAB — PMP SCREEN PROFILE (10S), URINE
Amphetamine Scrn, Ur: POSITIVE ng/mL — AB
BARBITURATE SCREEN URINE: NEGATIVE ng/mL
BENZODIAZEPINE SCREEN, URINE: NEGATIVE ng/mL
CANNABINOIDS UR QL SCN: NEGATIVE ng/mL
Cocaine (Metab) Scrn, Ur: NEGATIVE ng/mL
Creatinine(Crt), U: 175.6 mg/dL (ref 20.0–300.0)
Methadone Screen, Urine: NEGATIVE ng/mL
OXYCODONE+OXYMORPHONE UR QL SCN: NEGATIVE ng/mL
Opiate Scrn, Ur: NEGATIVE ng/mL
Ph of Urine: 5.3 (ref 4.5–8.9)
Phencyclidine Qn, Ur: NEGATIVE ng/mL
Propoxyphene Scrn, Ur: NEGATIVE ng/mL

## 2020-05-05 ENCOUNTER — Other Ambulatory Visit: Payer: Self-pay | Admitting: Family Medicine

## 2020-05-05 MED ORDER — AMPHETAMINE-DEXTROAMPHETAMINE 20 MG PO TABS: ORAL_TABLET | ORAL | 0 refills | Status: DC

## 2020-05-07 DIAGNOSIS — M9905 Segmental and somatic dysfunction of pelvic region: Secondary | ICD-10-CM | POA: Diagnosis not present

## 2020-05-07 DIAGNOSIS — M9903 Segmental and somatic dysfunction of lumbar region: Secondary | ICD-10-CM | POA: Diagnosis not present

## 2020-05-07 DIAGNOSIS — M9904 Segmental and somatic dysfunction of sacral region: Secondary | ICD-10-CM | POA: Diagnosis not present

## 2020-05-07 DIAGNOSIS — M5442 Lumbago with sciatica, left side: Secondary | ICD-10-CM | POA: Diagnosis not present

## 2020-05-12 DIAGNOSIS — M9903 Segmental and somatic dysfunction of lumbar region: Secondary | ICD-10-CM | POA: Diagnosis not present

## 2020-05-12 DIAGNOSIS — M9905 Segmental and somatic dysfunction of pelvic region: Secondary | ICD-10-CM | POA: Diagnosis not present

## 2020-05-12 DIAGNOSIS — M5442 Lumbago with sciatica, left side: Secondary | ICD-10-CM | POA: Diagnosis not present

## 2020-05-12 DIAGNOSIS — M9904 Segmental and somatic dysfunction of sacral region: Secondary | ICD-10-CM | POA: Diagnosis not present

## 2020-05-16 DIAGNOSIS — M9905 Segmental and somatic dysfunction of pelvic region: Secondary | ICD-10-CM | POA: Diagnosis not present

## 2020-05-16 DIAGNOSIS — M5442 Lumbago with sciatica, left side: Secondary | ICD-10-CM | POA: Diagnosis not present

## 2020-05-16 DIAGNOSIS — M9903 Segmental and somatic dysfunction of lumbar region: Secondary | ICD-10-CM | POA: Diagnosis not present

## 2020-05-16 DIAGNOSIS — M9904 Segmental and somatic dysfunction of sacral region: Secondary | ICD-10-CM | POA: Diagnosis not present

## 2020-05-19 DIAGNOSIS — M9905 Segmental and somatic dysfunction of pelvic region: Secondary | ICD-10-CM | POA: Diagnosis not present

## 2020-05-19 DIAGNOSIS — M9903 Segmental and somatic dysfunction of lumbar region: Secondary | ICD-10-CM | POA: Diagnosis not present

## 2020-05-19 DIAGNOSIS — M9904 Segmental and somatic dysfunction of sacral region: Secondary | ICD-10-CM | POA: Diagnosis not present

## 2020-05-19 DIAGNOSIS — M5442 Lumbago with sciatica, left side: Secondary | ICD-10-CM | POA: Diagnosis not present

## 2020-05-29 ENCOUNTER — Encounter: Payer: Self-pay | Admitting: Family Medicine

## 2020-05-29 DIAGNOSIS — M5442 Lumbago with sciatica, left side: Secondary | ICD-10-CM | POA: Diagnosis not present

## 2020-05-29 DIAGNOSIS — M9903 Segmental and somatic dysfunction of lumbar region: Secondary | ICD-10-CM | POA: Diagnosis not present

## 2020-05-29 DIAGNOSIS — M9905 Segmental and somatic dysfunction of pelvic region: Secondary | ICD-10-CM | POA: Diagnosis not present

## 2020-05-29 DIAGNOSIS — M9904 Segmental and somatic dysfunction of sacral region: Secondary | ICD-10-CM | POA: Diagnosis not present

## 2020-06-02 DIAGNOSIS — M5442 Lumbago with sciatica, left side: Secondary | ICD-10-CM | POA: Diagnosis not present

## 2020-06-02 DIAGNOSIS — M9904 Segmental and somatic dysfunction of sacral region: Secondary | ICD-10-CM | POA: Diagnosis not present

## 2020-06-02 DIAGNOSIS — M9903 Segmental and somatic dysfunction of lumbar region: Secondary | ICD-10-CM | POA: Diagnosis not present

## 2020-06-02 DIAGNOSIS — M9905 Segmental and somatic dysfunction of pelvic region: Secondary | ICD-10-CM | POA: Diagnosis not present

## 2020-06-10 ENCOUNTER — Other Ambulatory Visit: Payer: Self-pay | Admitting: Family Medicine

## 2020-06-10 NOTE — Telephone Encounter (Signed)
Rx request 

## 2020-06-11 MED ORDER — AMPHETAMINE-DEXTROAMPHETAMINE 20 MG PO TABS: ORAL_TABLET | ORAL | 0 refills | Status: DC

## 2020-06-30 ENCOUNTER — Other Ambulatory Visit: Payer: Self-pay | Admitting: Family Medicine

## 2020-06-30 ENCOUNTER — Encounter: Payer: Self-pay | Admitting: Family Medicine

## 2020-06-30 ENCOUNTER — Ambulatory Visit (INDEPENDENT_AMBULATORY_CARE_PROVIDER_SITE_OTHER): Payer: BC Managed Care – PPO | Admitting: Family Medicine

## 2020-06-30 ENCOUNTER — Other Ambulatory Visit: Payer: Self-pay

## 2020-06-30 VITALS — BP 128/82 | HR 74 | Temp 98.1°F | Ht 59.0 in | Wt 126.0 lb

## 2020-06-30 DIAGNOSIS — M2550 Pain in unspecified joint: Secondary | ICD-10-CM | POA: Diagnosis not present

## 2020-06-30 DIAGNOSIS — R0989 Other specified symptoms and signs involving the circulatory and respiratory systems: Secondary | ICD-10-CM | POA: Diagnosis not present

## 2020-06-30 DIAGNOSIS — F988 Other specified behavioral and emotional disorders with onset usually occurring in childhood and adolescence: Secondary | ICD-10-CM

## 2020-06-30 LAB — CBC WITH DIFFERENTIAL/PLATELET
Basophils Absolute: 0 10*3/uL (ref 0.0–0.1)
Basophils Relative: 0.1 % (ref 0.0–3.0)
Eosinophils Absolute: 0 10*3/uL (ref 0.0–0.7)
Eosinophils Relative: 0.8 % (ref 0.0–5.0)
HCT: 39.4 % (ref 36.0–46.0)
Hemoglobin: 13.4 g/dL (ref 12.0–15.0)
Lymphocytes Relative: 31 % (ref 12.0–46.0)
Lymphs Abs: 1 10*3/uL (ref 0.7–4.0)
MCHC: 34 g/dL (ref 30.0–36.0)
MCV: 98.1 fl (ref 78.0–100.0)
Monocytes Absolute: 0.4 10*3/uL (ref 0.1–1.0)
Monocytes Relative: 10.7 % (ref 3.0–12.0)
Neutro Abs: 1.9 10*3/uL (ref 1.4–7.7)
Neutrophils Relative %: 57.4 % (ref 43.0–77.0)
Platelets: 215 10*3/uL (ref 150.0–400.0)
RBC: 4.01 Mil/uL (ref 3.87–5.11)
RDW: 13.3 % (ref 11.5–15.5)
WBC: 3.4 10*3/uL — ABNORMAL LOW (ref 4.0–10.5)

## 2020-06-30 LAB — COMPREHENSIVE METABOLIC PANEL
ALT: 10 U/L (ref 0–35)
AST: 17 U/L (ref 0–37)
Albumin: 4.4 g/dL (ref 3.5–5.2)
Alkaline Phosphatase: 43 U/L (ref 39–117)
BUN: 9 mg/dL (ref 6–23)
CO2: 23 mEq/L (ref 19–32)
Calcium: 9.2 mg/dL (ref 8.4–10.5)
Chloride: 109 mEq/L (ref 96–112)
Creatinine, Ser: 0.69 mg/dL (ref 0.40–1.20)
GFR: 100.52 mL/min (ref >60.00)
Glucose, Bld: 114 mg/dL — ABNORMAL HIGH (ref 70–99)
Potassium: 4.8 mEq/L (ref 3.5–5.1)
Sodium: 142 mEq/L (ref 135–145)
Total Bilirubin: 0.8 mg/dL (ref 0.2–1.2)
Total Protein: 7.1 g/dL (ref 6.0–8.3)

## 2020-06-30 LAB — SEDIMENTATION RATE: Sed Rate: 3 mm/hr (ref 0–30)

## 2020-06-30 LAB — TSH: TSH: 1.24 u[IU]/mL (ref 0.35–4.50)

## 2020-06-30 LAB — C-REACTIVE PROTEIN: CRP: <1 mg/dL (ref 0.5–20.0)

## 2020-06-30 MED ORDER — AZELASTINE HCL 0.1 % NA SOLN: 1.0000 | Freq: Every morning | NASAL | 2 refills | Status: DC

## 2020-06-30 MED ORDER — PREDNISONE 20 MG PO TABS: 40.0000 mg | ORAL_TABLET | Freq: Every day | ORAL | 0 refills | Status: DC

## 2020-06-30 NOTE — Patient Instructions (Signed)
-  steroid course to see if it helps your sinuses.  -stop flonase x 1 week to see if this helps as it dries out the nose.  -if not getting better, would see ENT for scan.    -for joint pain, check LOTS of labs. R/u RA, ANA, other inflammatory issues.  -? If regular OA in your hands.  -take mobic prn if needed.   Your back is classic for sacroilitis. Handout given with exercises. Heat and SI belt help. I typically send to PT or sports medicine so if not getting better would see them.   I will miss you!  Aw

## 2020-06-30 NOTE — Progress Notes (Signed)
Patient: Raven Gomez MRN: [id number] DOB: 02-Mar-1970 PCP: Orma Flaming, MD     Subjective:  Chief Complaint  Patient presents with  . ADHD  . Joint Pain  . sinus issues    HPI: The patient is a 50 y.o. female who presents today for ADHD follow up.  She is currently on adderall 20mg . 1 tab in the Am and 1/2 prn in the afternoon. She has done her yearly UDS and fills as she should. Drug contract signed and she is happy with current dosing. No refills needed until next week. No side effects, takes as prescribed.   She also has complaints of joint pain all over. She isn't sure if it's due to post covid or if she has something else going on. Worse in her right thumb. Her knees hurt, dull achy pain, hip pain, back pain. She does get joint swelling in her hands from time to time. Pain doesn't get better throughout the day. She uses mobic very as needed and it does help. 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 Has received injections by her ortho in the past.   She also still has constant sinus stuff post covid. She uses her flonase daily. She still smells cigarettes and has some smell and taste issues. It just feels congested in her sinuses chronically. She gets dry mouth in the afternoon around 3pm. She states her sinuses just feel thick, like qtip residue in her nose. She has no pain or pressure in her sinuses. No fever/chills. She will use allegra D as needed as well and it does help as well.   Review of Systems  Constitutional: Negative for chills, fatigue and fever.  HENT: Negative for congestion, dental problem, ear pain, hearing loss, postnasal drip, sinus pressure, sinus pain, sore throat and trouble swallowing.        Cigarette smell. Stuffy feeling   Eyes: Negative for visual disturbance.  Respiratory: Negative for cough, chest tightness and shortness of breath.   Cardiovascular: Negative for chest pain, palpitations and leg swelling.  Gastrointestinal: Negative for abdominal  pain, blood in stool, diarrhea and nausea.  Endocrine: Negative for cold intolerance, polydipsia, polyphagia and polyuria.  Genitourinary: Negative for dysuria and hematuria.  Musculoskeletal: Positive for arthralgias and joint swelling. Negative for myalgias.  Skin: Negative for rash.  Neurological: Negative for dizziness, light-headedness and headaches.  Psychiatric/Behavioral: Negative for dysphoric mood and sleep disturbance. The patient is not nervous/anxious.     Allergies Patient has No Known Allergies.  Past Medical History Patient  has a past medical history of ADHD, Allergy, and UTI (urinary tract infection).  Surgical History Patient  has a past surgical history that includes Wisdom tooth extraction (2008); Cesarean section; Cholecystectomy (N/A, 08/28/2017); and ORIF toe fracture (Right, 12/11/2018).  Family History Pateint's family history includes Arthritis in her mother; Breast cancer (age of onset: 63) in her mother; COPD in her father; Cancer in her father, maternal grandmother, and mother; Depression in her brother, maternal grandmother, and mother; Diabetes in her maternal grandmother; Heart attack in her father; Heart disease in her paternal grandfather; Hypertension in her father, mother, and paternal grandfather; Mental illness in her brother and maternal grandmother; Stroke in her maternal grandfather and mother.  Social History Patient  reports that she has never smoked. She has never used smokeless tobacco. She reports current alcohol use. She reports that she does not use drugs.    Objective: Vitals:   06/30/20 1003  BP: 128/82  Pulse: 74  Temp: 98.1 F (36.7 C)  TempSrc: Temporal  SpO2: 100%  Weight: 126 lb (57.2 kg)  Height: 4\' 11"  (1.499 m)    Body mass index is 25.45 kg/m.  Physical Exam Vitals reviewed.  Constitutional:      Appearance: Normal appearance. She is well-developed and normal weight.  HENT:     Head: Normocephalic and atraumatic.      Comments: No ttp over sinuses     Right Ear: Tympanic membrane, ear canal and external ear normal.     Left Ear: Tympanic membrane, ear canal and external ear normal.     Nose:     Comments: Erythematous tubinates.     Mouth/Throat:     Mouth: Mucous membranes are moist.  Eyes:     Extraocular Movements: Extraocular movements intact.     Conjunctiva/sclera: Conjunctivae normal.     Pupils: Pupils are equal, round, and reactive to light.  Neck:     Thyroid: No thyromegaly.     Vascular: No carotid bruit.  Cardiovascular:     Rate and Rhythm: Normal rate and regular rhythm.     Pulses: Normal pulses.     Heart sounds: Normal heart sounds. No murmur heard.   Pulmonary:     Effort: Pulmonary effort is normal.     Breath sounds: Normal breath sounds.  Abdominal:     General: Abdomen is flat. Bowel sounds are normal. There is no distension.     Palpations: Abdomen is soft.     Tenderness: There is no abdominal tenderness.  Musculoskeletal:        General: Tenderness (TTP over bilateral MCP >right hand. no joint abnormality ) present. No swelling. Normal range of motion.     Cervical back: Normal range of motion and neck supple.  Lymphadenopathy:     Cervical: No cervical adenopathy.  Skin:    General: Skin is warm and dry.     Capillary Refill: Capillary refill takes less than 2 seconds.     Findings: No rash.  Neurological:     General: No focal deficit present.     Mental Status: She is alert and oriented to person, place, and time.     Cranial Nerves: No cranial nerve deficit.     Coordination: Coordination normal.     Deep Tendon Reflexes: Reflexes normal.  Psychiatric:        Mood and Affect: Mood normal.        Behavior: Behavior normal.        Assessment/plan: 1. Attention deficit disorder (ADD) without hyperactivity -well controlled on current meds, pmp website checked and filling correctly. Fill next week.  -yearly drug screen done 03/2020 -drug contract  signed.  -continue current regimen. F/u in 3 months for routine visit.   2. Arthralgia, unspecified joint W/u today with labs. R/o RA, but appears to be more OA with some possible tendon overuse with her job. Recommended voltaren gel, ice and mobic prn. She doesn't use often. ? If post covid sequelae as well. Will see if we need to refer once labs are back. Otherwise will f/u here in 3 months time.  - CBC with Differential/Platelet - Comprehensive metabolic panel - TSH - C-reactive protein - Sedimentation rate - Rheumatoid factor - ANA  3. Sinus complaint Def. Has long covid with the cigarette smell. Stop flonase x 2 weeks to make sure not irritating her more than helping. Cool mist humidifier at night. Steroid burst. If not getting better would send to ENT.  Return in about 3 months (around 09/30/2020) for routine ADD fu with alyssa .   Orma Flaming, MD [place]   06/30/2020

## 2020-07-01 LAB — ANA: Anti Nuclear Antibody (ANA): NEGATIVE

## 2020-07-01 LAB — RHEUMATOID FACTOR: Rheumatoid fact SerPl-aCnc: <14 IU/mL (ref <14)

## 2020-07-10 ENCOUNTER — Other Ambulatory Visit: Payer: Self-pay | Admitting: Family Medicine

## 2020-07-10 MED ORDER — AMPHETAMINE-DEXTROAMPHETAMINE 20 MG PO TABS: ORAL_TABLET | ORAL | 0 refills | Status: DC

## 2020-07-22 ENCOUNTER — Encounter: Payer: Self-pay | Admitting: Registered Nurse

## 2020-07-22 ENCOUNTER — Other Ambulatory Visit: Payer: Self-pay

## 2020-07-22 ENCOUNTER — Ambulatory Visit (INDEPENDENT_AMBULATORY_CARE_PROVIDER_SITE_OTHER): Payer: BC Managed Care – PPO | Admitting: Registered Nurse

## 2020-07-22 VITALS — BP 121/77 | HR 83 | Temp 98.2°F | Resp 18 | Ht 59.0 in | Wt 127.0 lb

## 2020-07-22 DIAGNOSIS — B029 Zoster without complications: Secondary | ICD-10-CM

## 2020-07-22 MED ORDER — VALACYCLOVIR HCL 1 G PO TABS: 1000.0000 mg | ORAL_TABLET | Freq: Three times a day (TID) | ORAL | 0 refills | Status: DC

## 2020-07-22 MED ORDER — GABAPENTIN 100 MG PO CAPS: 100.0000 mg | ORAL_CAPSULE | Freq: Three times a day (TID) | ORAL | 3 refills | Status: DC

## 2020-07-22 NOTE — Patient Instructions (Addendum)
Ms. Raven Gomez -  Sorry that you're not feeling well - hopefully we can curtail this course with valtrex and relieve symptoms with gabapentin  I have sent a referral to ophthalmology. They should call you soon  Let me know if things get worse or don't get better.  Thank you  Rich     If you have lab work done today you will be contacted with your lab results within the next 2 weeks.  If you have not heard from [place] then please contact us. The fastest way to get your results is to register for My Chart.   IF you received an x-ray today, you will receive an invoice from [facility]. Please contact [facility] at [phone] with questions or concerns regarding your invoice.   IF you received labwork today, you will receive an invoice from [facility]. Please contact [facility] at [phone] with questions or concerns regarding your invoice.   Our billing staff will not be able to assist you with questions regarding bills from these companies.  You will be contacted with the lab results as soon as they are available. The fastest way to get your results is to activate your My Chart account. Instructions are located on the last page of this paperwork. If you have not heard from [place] regarding the results in 2 weeks, please contact this office.

## 2020-07-22 NOTE — Progress Notes (Signed)
Acute Office Visit  Subjective:    Patient ID: Raven Gomez, female    DOB: 02-Mar-1970, 50 y.o.   MRN: [id number]  Chief Complaint  Patient presents with   Eye Pain    Patient states she has been having some eye pain with redness and some. Patient states she is now getting some cold sores on her lips and nose.    HPI Patient is in today for eye pain  Started with stiff neck on Saturday. Then some eye pain, drainage like conjunctivitis. Now more pain in eye, mildly blurred vision. Hair roots feel stiff. Pain is all on R side.  Did have two cold sores erupt on upper lip on L side. Pt thinks this to be coincidental.   Has not tried any treatment  Hx of zoster infection in 2016.  Past Medical History:  Diagnosis Date   ADHD    Allergy    UTI (urinary tract infection)     Past Surgical History:  Procedure Laterality Date   CESAREAN SECTION     CHOLECYSTECTOMY N/A 08/28/2017   Procedure: LAPAROSCOPIC CHOLECYSTECTOMY WITH INTRAOPERATIVE CHOLANGIOGRAM;  Surgeon: Alphonsa Overall, MD;  Location: [facility];  Service: General;  Laterality: N/A;   ORIF TOE FRACTURE Right 12/11/2018   Procedure: RIGHT 4TH TOE PINNING;  Surgeon: Erle Crocker, MD;  Location: [facility];  Service: Orthopedics;  Laterality: Right;  CPT CODE 21308 SURGERY REQUEST TIME 88 MINUTES   WISDOM TOOTH EXTRACTION  2008    Family History  Problem Relation Age of Onset   Breast cancer Mother 97       12/2014   Arthritis Mother    Cancer Mother    Depression Mother    Hypertension Mother    Stroke Mother    Cancer Father    COPD Father    Heart attack Father    Hypertension Father    Depression Brother    Mental illness Brother    Cancer Maternal Grandmother    Depression Maternal Grandmother    Diabetes Maternal Grandmother    Mental illness Maternal Grandmother    Stroke Maternal Grandfather    Heart disease Paternal Grandfather    Hypertension Paternal Grandfather    Colon  cancer Neg Hx    Esophageal cancer Neg Hx    Stomach cancer Neg Hx    Rectal cancer Neg Hx     Social History   Socioeconomic History   Marital status: Divorced    Spouse name: Not on file   Number of children: Not on file   Years of education: Not on file   Highest education level: Not on file  Occupational History   Not on file  Tobacco Use   Smoking status: Never   Smokeless tobacco: Never  Vaping Use   Vaping Use: Never used  Substance and Sexual Activity   Alcohol use: Yes    Comment: 3-5 glasses of wine per week   Drug use: No   Sexual activity: Not on file  Other Topics Concern   Not on file  Social History Narrative   Not on file   Social Determinants of Health   Financial Resource Strain: Not on file  Food Insecurity: Not on file  Transportation Needs: Not on file  Physical Activity: Not on file  Stress: Not on file  Social Connections: Not on file  Intimate Partner Violence: Not on file    Outpatient Medications Prior to Visit  Medication Sig Dispense Refill  amphetamine-dextroamphetamine (ADDERALL) 20 MG tablet One tab in AM and 1/2 in pm as needed 45 tablet 0   azelastine (ASTELIN) 0.1 % nasal spray Place 1 spray into both nostrils in the morning. Use in each nostril as directed 30 mL 2   Cholecalciferol (VITAMIN D) 50 MCG (2000 UT) CAPS Take 1,000 Units by mouth 2 (two) times daily.     fluorouracil (EFUDEX) 5 % cream Apply topically 2 (two) times daily.     magnesium gluconate (MAGONATE) 500 MG tablet Take 500 mg by mouth 2 (two) times daily.     predniSONE (DELTASONE) 20 MG tablet Take 2 tablets (40 mg total) by mouth daily with breakfast. 10 tablet 0   valACYclovir (VALTREX) 1000 MG tablet Take one tablet twice a day x one dose 20 tablet 0   meloxicam (MOBIC) 15 MG tablet Take 15 mg by mouth 3 (three) times daily. (Patient not taking: No sig reported)     Facility-Administered Medications Prior to Visit  Medication Dose Route Frequency Provider  Last Rate Last Admin   0.9 %  sodium chloride infusion  500 mL Intravenous Once Danis, Kirke Corin, MD        Not on File  Review of Systems  Constitutional: Negative.   HENT: Negative.    Eyes:  Positive for pain and visual disturbance.  Respiratory: Negative.    Cardiovascular: Negative.   Gastrointestinal: Negative.   Genitourinary: Negative.   Musculoskeletal: Negative.   Skin:  Positive for rash.  Neurological: Negative.   Psychiatric/Behavioral: Negative.        Objective:    Physical Exam Vitals and nursing note reviewed.  Constitutional:      General: She is not in acute distress.    Appearance: Normal appearance. She is not ill-appearing, toxic-appearing or diaphoretic.  HENT:     Head: Normocephalic and atraumatic.     Right Ear: Tympanic membrane normal.     Left Ear: Tympanic membrane normal.  Eyes:     Conjunctiva/sclera:     Right eye: Right conjunctiva is injected. Chemosis present. No exudate or hemorrhage.    Left eye: Left conjunctiva is not injected. No chemosis, exudate or hemorrhage. Cardiovascular:     Rate and Rhythm: Normal rate and regular rhythm.     Pulses: Normal pulses.     Heart sounds: Normal heart sounds. No murmur heard.   No friction rub. No gallop.  Pulmonary:     Effort: Pulmonary effort is normal. No respiratory distress.     Breath sounds: Normal breath sounds. No stridor. No wheezing, rhonchi or rales.  Chest:     Chest wall: No tenderness.  Skin:    General: Skin is warm and dry.     Capillary Refill: Capillary refill takes less than 2 seconds.     Findings: Rash (patchy redness on R side of scalp. no vesicles.) present.  Neurological:     General: No focal deficit present.     Mental Status: She is alert and oriented to person, place, and time. Mental status is at baseline.  Psychiatric:        Mood and Affect: Mood normal.        Behavior: Behavior normal.        Thought Content: Thought content normal.        Judgment:  Judgment normal.    BP 121/77   Pulse 83   Temp 98.2 F (36.8 C) (Temporal)   Resp 18   Ht 4\' 11"  (  1.499 m)   Wt 127 lb (57.6 kg)   BMI 25.65 kg/m  Wt Readings from Last 3 Encounters:  07/22/20 127 lb (57.6 kg)  06/30/20 126 lb (57.2 kg)  03/31/20 126 lb 9.6 oz (57.4 kg)    There are no preventive care reminders to display for this patient.  There are no preventive care reminders to display for this patient.   Lab Results  Component Value Date   TSH 1.24 06/30/2020   Lab Results  Component Value Date   WBC 3.4 (L) 06/30/2020   HGB 13.4 06/30/2020   HCT 39.4 06/30/2020   MCV 98.1 06/30/2020   PLT 215.0 06/30/2020   Lab Results  Component Value Date   NA 142 06/30/2020   K 4.8 06/30/2020   CO2 23 06/30/2020   GLUCOSE 114 (H) 06/30/2020   BUN 9 06/30/2020   CREATININE 0.69 06/30/2020   BILITOT 0.8 06/30/2020   ALKPHOS 43 06/30/2020   AST 17 06/30/2020   ALT 10 06/30/2020   PROT 7.1 06/30/2020   ALBUMIN 4.4 06/30/2020   CALCIUM 9.2 06/30/2020   ANIONGAP 9 08/27/2017   GFR 100.52 06/30/2020   Lab Results  Component Value Date   CHOL 179 03/31/2020   Lab Results  Component Value Date   HDL 91.40 03/31/2020   Lab Results  Component Value Date   LDLCALC 75 03/31/2020   Lab Results  Component Value Date   TRIG 64.0 03/31/2020   Lab Results  Component Value Date   CHOLHDL 2 03/31/2020   No results found for: HGBA1C     Assessment & Plan:   Problem List Items Addressed This Visit   None Visit Diagnoses     Herpes zoster without complication    -  Primary   Relevant Medications   gabapentin (NEURONTIN) 100 MG capsule   valACYclovir (VALTREX) 1000 MG tablet   Other Relevant Orders   Ambulatory referral to Ophthalmology        Meds ordered this encounter  Medications   gabapentin (NEURONTIN) 100 MG capsule    Sig: Take 1 capsule (100 mg total) by mouth 3 (three) times daily.    Dispense:  90 capsule    Refill:  3    Order Specific  Question:   Supervising Provider    Answer:   Carlota Raspberry, JEFFREY R [[id number]]   valACYclovir (VALTREX) 1000 MG tablet    Sig: Take 1 tablet (1,000 mg total) by mouth 3 (three) times daily.    Dispense:  21 tablet    Refill:  0    Order Specific Question:   Supervising Provider    Answer:   Carlota Raspberry, JEFFREY R [[id number]]    PLAN Concern for zoster. Will treat as such with valtrex as above. This should help with cold sores as well.  No distinct lesions in or around eye but given pain will take precaution of urgent ophthalmology referral. Gabapentin for pain relief Fortunately TM wnl Discussed nonpharm Discussed ER precautions Patient encouraged to call clinic with any questions, comments, or concerns. Maximiano Coss, NP

## 2020-07-25 DIAGNOSIS — B0233 Zoster keratitis: Secondary | ICD-10-CM | POA: Diagnosis not present

## 2020-08-01 DIAGNOSIS — B0233 Zoster keratitis: Secondary | ICD-10-CM | POA: Diagnosis not present

## 2020-08-14 ENCOUNTER — Other Ambulatory Visit: Payer: Self-pay | Admitting: Family Medicine

## 2020-08-14 ENCOUNTER — Other Ambulatory Visit: Payer: Self-pay | Admitting: Physician Assistant

## 2020-08-14 ENCOUNTER — Encounter: Payer: Self-pay | Admitting: Physician Assistant

## 2020-08-14 MED ORDER — AMPHETAMINE-DEXTROAMPHETAMINE 20 MG PO TABS: ORAL_TABLET | ORAL | 0 refills | Status: DC

## 2020-08-14 NOTE — Telephone Encounter (Signed)
Pt requesting refill for Adderall 20 mg. Has an appt with you on 10/06/2020.

## 2020-09-22 ENCOUNTER — Encounter: Payer: Self-pay | Admitting: Physician Assistant

## 2020-09-22 MED ORDER — AMPHETAMINE-DEXTROAMPHETAMINE 20 MG PO TABS: ORAL_TABLET | ORAL | 0 refills | Status: DC

## 2020-10-06 ENCOUNTER — Other Ambulatory Visit: Payer: Self-pay

## 2020-10-06 ENCOUNTER — Encounter: Payer: Self-pay | Admitting: Physician Assistant

## 2020-10-06 ENCOUNTER — Ambulatory Visit: Payer: BC Managed Care – PPO | Admitting: Physician Assistant

## 2020-10-06 VITALS — Ht 59.0 in

## 2020-10-06 DIAGNOSIS — F988 Other specified behavioral and emotional disorders with onset usually occurring in childhood and adolescence: Secondary | ICD-10-CM | POA: Diagnosis not present

## 2020-10-06 NOTE — Progress Notes (Signed)
Established Patient Office Visit  Subjective:  Patient ID: Raven Gomez, female    DOB: 1970/03/14  Age: 50 y.o. MRN: [id number]  CC:  Chief Complaint  Patient presents with   Transitions Of Care   ADD    HPI [name] presents for [facility] from Dr. Rogers Blocker and ADHD f/up.   ADHD w/o hyperactivity - [facility] official diagnosis around 2009. Initially had the extended release, but says it made her feel more sluggish. Does well with current regimen of Adderall 20 mg daily. W/o the medication she does bounce from one project to the next and leaves tasks unfinished. More forgetful without it as well. No side effects. No palpitations or CP. No trouble sleeping.  Past Medical History:  Diagnosis Date   ADHD    Allergy    UTI (urinary tract infection)     Past Surgical History:  Procedure Laterality Date   CESAREAN SECTION     CHOLECYSTECTOMY N/A 08/28/2017   Procedure: LAPAROSCOPIC CHOLECYSTECTOMY WITH INTRAOPERATIVE CHOLANGIOGRAM;  Surgeon: Alphonsa Overall, MD;  Location: [facility];  Service: General;  Laterality: N/A;   ORIF TOE FRACTURE Right 12/11/2018   Procedure: RIGHT 4TH TOE PINNING;  Surgeon: Erle Crocker, MD;  Location: [place];  Service: Orthopedics;  Laterality: Right;  CPT CODE 57846 SURGERY REQUEST TIME 91 MINUTES   WISDOM TOOTH EXTRACTION  2008    Family History  Problem Relation Age of Onset   Breast cancer Mother 75       12/2014   Arthritis Mother    Cancer Mother    Depression Mother    Hypertension Mother    Stroke Mother    Cancer Father    COPD Father    Heart attack Father    Hypertension Father    Depression Brother    Mental illness Brother    Cancer Maternal Grandmother    Depression Maternal Grandmother    Diabetes Maternal Grandmother    Mental illness Maternal Grandmother    Stroke Maternal Grandfather    Heart disease Paternal Grandfather    Hypertension Paternal Grandfather    Colon cancer  Neg Hx    Esophageal cancer Neg Hx    Stomach cancer Neg Hx    Rectal cancer Neg Hx     Social History   Socioeconomic History   Marital status: Divorced    Spouse name: Not on file   Number of children: Not on file   Years of education: Not on file   Highest education level: Not on file  Occupational History   Not on file  Tobacco Use   Smoking status: Never   Smokeless tobacco: Never  Vaping Use   Vaping Use: Never used  Substance and Sexual Activity   Alcohol use: Yes    Comment: 3-5 glasses of wine per week   Drug use: No   Sexual activity: Not on file  Other Topics Concern   Not on file  Social History Narrative   Not on file   Social Determinants of Health   Financial Resource Strain: Not on file  Food Insecurity: Not on file  Transportation Needs: Not on file  Physical Activity: Not on file  Stress: Not on file  Social Connections: Not on file  Intimate Partner Violence: Not on file    Outpatient Medications Prior to Visit  Medication Sig Dispense Refill   amphetamine-dextroamphetamine (ADDERALL) 20 MG tablet One tab in AM and 1/2 in pm as needed  45 tablet 0   azelastine (ASTELIN) 0.1 % nasal spray Place 1 spray into both nostrils in the morning. Use in each nostril as directed 30 mL 2   Cholecalciferol (VITAMIN D) 50 MCG (2000 UT) CAPS Take 1,000 Units by mouth 2 (two) times daily.     fluorouracil (EFUDEX) 5 % cream Apply topically 2 (two) times daily.     gabapentin (NEURONTIN) 100 MG capsule Take 1 capsule (100 mg total) by mouth 3 (three) times daily. 90 capsule 3   magnesium gluconate (MAGONATE) 500 MG tablet Take 500 mg by mouth 2 (two) times daily.     meloxicam (MOBIC) 15 MG tablet Take 15 mg by mouth 3 (three) times daily. (Patient not taking: No sig reported)     predniSONE (DELTASONE) 20 MG tablet Take 2 tablets (40 mg total) by mouth daily with breakfast. (Patient not taking: Reported on 10/06/2020) 10 tablet 0   valACYclovir (VALTREX) 1000 MG  tablet Take 1 tablet (1,000 mg total) by mouth 3 (three) times daily. 21 tablet 0   Facility-Administered Medications Prior to Visit  Medication Dose Route Frequency Provider Last Rate Last Admin   0.9 %  sodium chloride infusion  500 mL Intravenous Once Danis, Kirke Corin, MD        Not on File  ROS Review of Systems REFER TO HPI FOR PERTINENT POSITIVES AND NEGATIVES    Objective:    Physical Exam Vitals and nursing note reviewed.  Constitutional:      General: She is not in acute distress.    Appearance: Normal appearance. She is normal weight.  HENT:     Head: Normocephalic.     Right Ear: External ear normal.     Left Ear: External ear normal.     Nose: Nose normal.     Mouth/Throat:     Mouth: Mucous membranes are moist.  Eyes:     Extraocular Movements: Extraocular movements intact.     Conjunctiva/sclera: Conjunctivae normal.     Pupils: Pupils are equal, round, and reactive to light.  Cardiovascular:     Rate and Rhythm: Normal rate and regular rhythm.     Pulses: Normal pulses.     Heart sounds: No murmur heard. Pulmonary:     Effort: Pulmonary effort is normal.     Breath sounds: Normal breath sounds.  Abdominal:     Tenderness: There is no abdominal tenderness.  Musculoskeletal:        General: Normal range of motion.     Cervical back: Normal range of motion.  Skin:    General: Skin is warm.  Neurological:     General: No focal deficit present.     Mental Status: She is alert and oriented to person, place, and time.     Gait: Gait normal.  Psychiatric:        Mood and Affect: Mood normal.        Behavior: Behavior normal.    Ht '4\' 11"'$  (1.499 m)   BMI 25.65 kg/m  Wt Readings from Last 3 Encounters:  07/22/20 127 lb (57.6 kg)  06/30/20 126 lb (57.2 kg)  03/31/20 126 lb 9.6 oz (57.4 kg)     Health Maintenance Due  Topic Date Due   COVID-19 Vaccine (1) Never done   PAP SMEAR-Modifier  04/08/2020   INFLUENZA VACCINE  09/08/2020    There  are no preventive care reminders to display for this patient.  Lab Results  Component Value Date  TSH 1.24 06/30/2020   Lab Results  Component Value Date   WBC 3.4 (L) 06/30/2020   HGB 13.4 06/30/2020   HCT 39.4 06/30/2020   MCV 98.1 06/30/2020   PLT 215.0 06/30/2020   Lab Results  Component Value Date   NA 142 06/30/2020   K 4.8 06/30/2020   CO2 23 06/30/2020   GLUCOSE 114 (H) 06/30/2020   BUN 9 06/30/2020   CREATININE 0.69 06/30/2020   BILITOT 0.8 06/30/2020   ALKPHOS 43 06/30/2020   AST 17 06/30/2020   ALT 10 06/30/2020   PROT 7.1 06/30/2020   ALBUMIN 4.4 06/30/2020   CALCIUM 9.2 06/30/2020   ANIONGAP 9 08/27/2017   GFR 100.52 06/30/2020   Lab Results  Component Value Date   CHOL 179 03/31/2020   Lab Results  Component Value Date   HDL 91.40 03/31/2020   Lab Results  Component Value Date   LDLCALC 75 03/31/2020   Lab Results  Component Value Date   TRIG 64.0 03/31/2020   Lab Results  Component Value Date   CHOLHDL 2 03/31/2020   No results found for: HGBA1C    Assessment & Plan:   Problem List Items Addressed This Visit   None  1. Attention deficit disorder (ADD) without hyperactivity -PDMP reviewed, no red flags, filling appropriately -Stable on Adderall 20 mg daily -Not due for refill yet -Contract renewed with me -F/up in 3 months med check   Follow-up: No follow-ups on file.    Lyndsy Gilberto M Dama Hedgepeth, PA-C

## 2020-10-06 NOTE — Patient Instructions (Signed)
Good to meet you in person, today. All is stable with your current medication regimen. See you in 3 months. Thanks for renewing your contract with me today.

## 2020-10-29 ENCOUNTER — Other Ambulatory Visit: Payer: Self-pay | Admitting: Physician Assistant

## 2020-10-30 MED ORDER — AMPHETAMINE-DEXTROAMPHETAMINE 20 MG PO TABS: ORAL_TABLET | ORAL | 0 refills | Status: DC

## 2020-10-31 ENCOUNTER — Encounter: Payer: Self-pay | Admitting: Physician Assistant

## 2020-12-04 ENCOUNTER — Other Ambulatory Visit: Payer: Self-pay | Admitting: Physician Assistant

## 2020-12-04 MED ORDER — AMPHETAMINE-DEXTROAMPHETAMINE 20 MG PO TABS: ORAL_TABLET | ORAL | 0 refills | Status: DC

## 2020-12-08 ENCOUNTER — Other Ambulatory Visit: Payer: Self-pay | Admitting: Physician Assistant

## 2020-12-08 ENCOUNTER — Encounter: Payer: Self-pay | Admitting: Physician Assistant

## 2020-12-08 MED ORDER — AMPHETAMINE-DEXTROAMPHETAMINE 20 MG PO TABS: ORAL_TABLET | ORAL | 0 refills | Status: DC

## 2020-12-15 DIAGNOSIS — L820 Inflamed seborrheic keratosis: Secondary | ICD-10-CM | POA: Diagnosis not present

## 2020-12-15 DIAGNOSIS — L570 Actinic keratosis: Secondary | ICD-10-CM | POA: Diagnosis not present

## 2020-12-15 DIAGNOSIS — L814 Other melanin hyperpigmentation: Secondary | ICD-10-CM | POA: Diagnosis not present

## 2020-12-15 DIAGNOSIS — D485 Neoplasm of uncertain behavior of skin: Secondary | ICD-10-CM | POA: Diagnosis not present

## 2020-12-15 DIAGNOSIS — D045 Carcinoma in situ of skin of trunk: Secondary | ICD-10-CM | POA: Diagnosis not present

## 2020-12-15 DIAGNOSIS — D225 Melanocytic nevi of trunk: Secondary | ICD-10-CM | POA: Diagnosis not present

## 2020-12-15 DIAGNOSIS — D692 Other nonthrombocytopenic purpura: Secondary | ICD-10-CM | POA: Diagnosis not present

## 2020-12-15 DIAGNOSIS — L821 Other seborrheic keratosis: Secondary | ICD-10-CM | POA: Diagnosis not present

## 2021-01-06 ENCOUNTER — Encounter: Payer: Self-pay | Admitting: Physician Assistant

## 2021-01-06 ENCOUNTER — Ambulatory Visit: Payer: BC Managed Care – PPO | Admitting: Physician Assistant

## 2021-01-06 ENCOUNTER — Other Ambulatory Visit: Payer: Self-pay

## 2021-01-06 VITALS — BP 120/76 | HR 86 | Temp 98.1°F | Ht 61.0 in | Wt 124.2 lb

## 2021-01-06 DIAGNOSIS — F988 Other specified behavioral and emotional disorders with onset usually occurring in childhood and adolescence: Secondary | ICD-10-CM

## 2021-01-06 DIAGNOSIS — M19041 Primary osteoarthritis, right hand: Secondary | ICD-10-CM | POA: Diagnosis not present

## 2021-01-06 DIAGNOSIS — E538 Deficiency of other specified B group vitamins: Secondary | ICD-10-CM | POA: Diagnosis not present

## 2021-01-06 DIAGNOSIS — E559 Vitamin D deficiency, unspecified: Secondary | ICD-10-CM

## 2021-01-06 DIAGNOSIS — F411 Generalized anxiety disorder: Secondary | ICD-10-CM | POA: Diagnosis not present

## 2021-01-06 DIAGNOSIS — D72819 Decreased white blood cell count, unspecified: Secondary | ICD-10-CM | POA: Diagnosis not present

## 2021-01-06 DIAGNOSIS — R739 Hyperglycemia, unspecified: Secondary | ICD-10-CM

## 2021-01-06 LAB — CBC WITH DIFFERENTIAL/PLATELET
Basophils Absolute: 0 10*3/uL (ref 0.0–0.1)
Basophils Relative: 0.3 % (ref 0.0–3.0)
Eosinophils Absolute: 0 10*3/uL (ref 0.0–0.7)
Eosinophils Relative: 0.5 % (ref 0.0–5.0)
HCT: 41.1 % (ref 36.0–46.0)
Hemoglobin: 14.1 g/dL (ref 12.0–15.0)
Lymphocytes Relative: 25.5 % (ref 12.0–46.0)
Lymphs Abs: 1 10*3/uL (ref 0.7–4.0)
MCHC: 34.2 g/dL (ref 30.0–36.0)
MCV: 97.4 fl (ref 78.0–100.0)
Monocytes Absolute: 0.4 10*3/uL (ref 0.1–1.0)
Monocytes Relative: 10.1 % (ref 3.0–12.0)
Neutro Abs: 2.4 10*3/uL (ref 1.4–7.7)
Neutrophils Relative %: 63.6 % (ref 43.0–77.0)
Platelets: 218 10*3/uL (ref 150.0–400.0)
RBC: 4.22 Mil/uL (ref 3.87–5.11)
RDW: 12.9 % (ref 11.5–15.5)
WBC: 3.8 10*3/uL — ABNORMAL LOW (ref 4.0–10.5)

## 2021-01-06 LAB — COMPREHENSIVE METABOLIC PANEL
ALT: 12 U/L (ref 0–35)
AST: 18 U/L (ref 0–37)
Albumin: 4.6 g/dL (ref 3.5–5.2)
Alkaline Phosphatase: 45 U/L (ref 39–117)
BUN: 12 mg/dL (ref 6–23)
CO2: 26 mEq/L (ref 19–32)
Calcium: 9.8 mg/dL (ref 8.4–10.5)
Chloride: 103 mEq/L (ref 96–112)
Creatinine, Ser: 0.71 mg/dL (ref 0.40–1.20)
GFR: 98.12 mL/min (ref >60.00)
Glucose, Bld: 102 mg/dL — ABNORMAL HIGH (ref 70–99)
Potassium: 5.3 mEq/L — ABNORMAL HIGH (ref 3.5–5.1)
Sodium: 136 mEq/L (ref 135–145)
Total Bilirubin: 0.8 mg/dL (ref 0.2–1.2)
Total Protein: 7.2 g/dL (ref 6.0–8.3)

## 2021-01-06 LAB — HEMOGLOBIN A1C: Hgb A1c MFr Bld: 4.8 % (ref 4.6–6.5)

## 2021-01-06 LAB — VITAMIN D 25 HYDROXY (VIT D DEFICIENCY, FRACTURES): VITD: 42.79 ng/mL (ref 30.00–100.00)

## 2021-01-06 LAB — VITAMIN B12: Vitamin B-12: 303 pg/mL (ref 211–911)

## 2021-01-06 MED ORDER — AMPHETAMINE-DEXTROAMPHETAMINE 20 MG PO TABS: 20.0000 mg | ORAL_TABLET | Freq: Every day | ORAL | 0 refills | Status: DC

## 2021-01-06 MED ORDER — FLUOXETINE HCL 20 MG PO CAPS: 20.0000 mg | ORAL_CAPSULE | Freq: Every morning | ORAL | 1 refills | Status: DC

## 2021-01-06 NOTE — Progress Notes (Signed)
Subjective:    Patient ID: Raven Gomez, female    DOB: 12-Aug-1970, 50 y.o.   MRN: [id number]  Chief Complaint  Patient presents with   Follow-up    3 month follow-up for ADHD Fasting     HPI Patient is in today for 3 month f/up for ADHD recheck.   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 Initially had the extended release, but says it made her feel more sluggish. Does well with current regimen of Adderall 20 mg daily. W/o the medication she does bounce from one project to the next and leaves tasks unfinished. More forgetful without it as well. No side effects. No palpitations or CP. No trouble sleeping.  TODAY: She still takes Adderall 20 mg daily when she is at work. Usually has to take the second 1/2 tab in the afternoons as well to stay focused. Doing well, no problems with this medication.  She would like some labs checked today.  She also wants to start back on Fluoxetine due to recent stress of finding assisted living for mother.    Past Medical History:  Diagnosis Date   ADHD    Allergy    UTI (urinary tract infection)     Past Surgical History:  Procedure Laterality Date   CESAREAN SECTION     CHOLECYSTECTOMY N/A 08/28/2017   Procedure: LAPAROSCOPIC CHOLECYSTECTOMY WITH INTRAOPERATIVE CHOLANGIOGRAM;  Surgeon: Alphonsa Overall, MD;  Location: [facility];  Service: General;  Laterality: N/A;   ORIF TOE FRACTURE Right 12/11/2018   Procedure: RIGHT 4TH TOE PINNING;  Surgeon: Erle Crocker, MD;  Location: [facility];  Service: Orthopedics;  Laterality: Right;  CPT CODE 20254 SURGERY REQUEST TIME 85 MINUTES   WISDOM TOOTH EXTRACTION  2008    Family History  Problem Relation Age of Onset   Breast cancer Mother 67       12/2014   Arthritis Mother    Cancer Mother    Depression Mother    Hypertension Mother    Stroke Mother    Cancer Father    COPD Father    Heart attack Father     Hypertension Father    Depression Brother    Mental illness Brother    Cancer Maternal Grandmother    Depression Maternal Grandmother    Diabetes Maternal Grandmother    Mental illness Maternal Grandmother    Stroke Maternal Grandfather    Heart disease Paternal Grandfather    Hypertension Paternal Grandfather    Colon cancer Neg Hx    Esophageal cancer Neg Hx    Stomach cancer Neg Hx    Rectal cancer Neg Hx     Social History   Tobacco Use   Smoking status: Never   Smokeless tobacco: Never  Vaping Use   Vaping Use: Never used  Substance Use Topics   Alcohol use: Yes    Comment: 3-5 glasses of wine per week   Drug use: No     No Known Allergies  Review of Systems NEGATIVE UNLESS OTHERWISE INDICATED IN HPI      Objective:     BP 120/76   Pulse 86   Temp 98.1 F (36.7 C) (Temporal)   Ht 5\' 1"  (1.549 m)   Wt 124 lb 4 oz (56.4 kg)   LMP 12/05/2020   SpO2 99%   BMI 23.48 kg/m   Wt Readings from Last 3 Encounters:  01/06/21 124 lb 4 oz (56.4 kg)  07/22/20  127 lb (57.6 kg)  06/30/20 126 lb (57.2 kg)    BP Readings from Last 3 Encounters:  01/06/21 120/76  07/22/20 121/77  06/30/20 128/82     Physical Exam Vitals and nursing note reviewed.  Constitutional:      General: She is not in acute distress.    Appearance: Normal appearance. She is normal weight.  HENT:     Head: Normocephalic.     Right Ear: External ear normal.     Left Ear: External ear normal.     Nose: Nose normal.     Mouth/Throat:     Mouth: Mucous membranes are moist.  Eyes:     Extraocular Movements: Extraocular movements intact.     Conjunctiva/sclera: Conjunctivae normal.     Pupils: Pupils are equal, round, and reactive to light.  Cardiovascular:     Rate and Rhythm: Normal rate and regular rhythm.     Pulses: Normal pulses.     Heart sounds: No murmur heard. Pulmonary:     Effort: Pulmonary effort is normal.     Breath sounds: Normal breath sounds.  Abdominal:      Tenderness: There is no abdominal tenderness.  Musculoskeletal:        General: Normal range of motion.     Cervical back: Normal range of motion.  Skin:    General: Skin is warm.  Neurological:     General: No focal deficit present.     Mental Status: She is alert and oriented to person, place, and time.     Gait: Gait normal.  Psychiatric:        Mood and Affect: Mood normal.        Behavior: Behavior normal.       Assessment & Plan:   Problem List Items Addressed This Visit       Other   GAD (generalized anxiety disorder)   Relevant Medications   FLUoxetine (PROZAC) 20 MG capsule   ADD (attention deficit disorder) - Primary   Other Visit Diagnoses     Primary osteoarthritis of right hand       Relevant Orders   Comprehensive metabolic panel   Hyperglycemia       Relevant Orders   Comprehensive metabolic panel   Hemoglobin A1c   Leukopenia, unspecified type       Relevant Orders   CBC with Differential/Platelet   Vitamin D deficiency       Relevant Orders   VITAMIN D 25 Hydroxy (Vit-D Deficiency, Fractures)   Vitamin B12 deficiency       Relevant Orders   Vitamin B12        Meds ordered this encounter  Medications   FLUoxetine (PROZAC) 20 MG capsule    Sig: Take 1 capsule (20 mg total) by mouth every morning.    Dispense:  90 capsule    Refill:  1   amphetamine-dextroamphetamine (ADDERALL) 20 MG tablet    Sig: Take 1 tablet (20 mg total) by mouth daily before breakfast. Take 1/2 tab in pm prn.    Dispense:  45 tablet    Refill:  0   amphetamine-dextroamphetamine (ADDERALL) 20 MG tablet    Sig: Take 1 tablet (20 mg total) by mouth daily before breakfast. Take 1/2 tab in pm prn.    Dispense:  45 tablet    Refill:  0   amphetamine-dextroamphetamine (ADDERALL) 20 MG tablet    Sig: Take 1 tablet (20 mg total) by mouth daily before breakfast. Take 1/2  tab in pm prn.    Dispense:  45 tablet    Refill:  0   PLAN: -PDMP reviewed today, no red flags,  filling appropriately. 3 months Adderall 20 mg supply refilled. -Start back on Fluoxetine 20 mg daily. She will let me know how she's doing. -Labs today, will call with results. -Meloxicam prn arthritis in hand. She will call for injection with sports med if needed.  -Recheck in 3 months with me   Emelee Rodocker M Deziree Mokry, PA-C

## 2021-01-12 ENCOUNTER — Other Ambulatory Visit: Payer: Self-pay | Admitting: Physician Assistant

## 2021-01-12 ENCOUNTER — Encounter: Payer: Self-pay | Admitting: Physician Assistant

## 2021-01-13 ENCOUNTER — Other Ambulatory Visit: Payer: Self-pay | Admitting: Physician Assistant

## 2021-01-15 ENCOUNTER — Other Ambulatory Visit: Payer: Self-pay | Admitting: Physician Assistant

## 2021-01-15 MED ORDER — AMPHETAMINE-DEXTROAMPHETAMINE 20 MG PO TABS: ORAL_TABLET | ORAL | 0 refills | Status: DC

## 2021-01-15 MED ORDER — AMPHETAMINE-DEXTROAMPHETAMINE 20 MG PO TABS: 20.0000 mg | ORAL_TABLET | Freq: Every day | ORAL | 0 refills | Status: DC

## 2021-01-15 NOTE — Telephone Encounter (Signed)
Rx sent 

## 2021-02-16 ENCOUNTER — Other Ambulatory Visit: Payer: Self-pay | Admitting: Physician Assistant

## 2021-02-16 MED ORDER — AMPHETAMINE-DEXTROAMPHETAMINE 20 MG PO TABS: ORAL_TABLET | ORAL | 0 refills | Status: DC

## 2021-02-17 ENCOUNTER — Other Ambulatory Visit: Payer: Self-pay | Admitting: Physician Assistant

## 2021-02-17 MED ORDER — AMPHETAMINE-DEXTROAMPHETAMINE 20 MG PO TABS: ORAL_TABLET | ORAL | 0 refills | Status: DC

## 2021-02-19 ENCOUNTER — Other Ambulatory Visit: Payer: Self-pay | Admitting: Physician Assistant

## 2021-02-19 MED ORDER — AMPHETAMINE-DEXTROAMPHETAMINE 10 MG PO TABS: ORAL_TABLET | ORAL | 0 refills | Status: DC

## 2021-02-23 LAB — HM PAP SMEAR: HPV, high-risk: NEGATIVE

## 2021-03-22 ENCOUNTER — Telehealth: Payer: Self-pay | Admitting: Physician Assistant

## 2021-03-23 ENCOUNTER — Other Ambulatory Visit: Payer: Self-pay | Admitting: Physician Assistant

## 2021-03-23 MED ORDER — AMPHETAMINE-DEXTROAMPHETAMINE 20 MG PO TABS: ORAL_TABLET | ORAL | 0 refills | Status: DC

## 2021-03-23 NOTE — Telephone Encounter (Signed)
Patient states that her pharmacy is out and would like it sent here instead.   [facility] [place], [place] AT [address]

## 2021-03-24 MED ORDER — AMPHETAMINE-DEXTROAMPHETAMINE 20 MG PO TABS: ORAL_TABLET | ORAL | 0 refills | Status: DC

## 2021-03-24 NOTE — Telephone Encounter (Signed)
Rx sent 

## 2021-03-25 ENCOUNTER — Other Ambulatory Visit: Payer: Self-pay | Admitting: Physician Assistant

## 2021-03-25 MED ORDER — AMPHETAMINE-DEXTROAMPHETAMINE 20 MG PO TABS: 20.0000 mg | ORAL_TABLET | Freq: Every day | ORAL | 0 refills | Status: DC

## 2021-04-08 ENCOUNTER — Ambulatory Visit: Payer: BC Managed Care – PPO | Admitting: Physician Assistant

## 2021-04-14 ENCOUNTER — Ambulatory Visit (INDEPENDENT_AMBULATORY_CARE_PROVIDER_SITE_OTHER): Payer: Commercial Managed Care - HMO | Admitting: Physician Assistant

## 2021-04-14 VITALS — BP 119/76 | HR 90 | Temp 99.5°F | Ht 61.0 in | Wt 127.2 lb

## 2021-04-14 DIAGNOSIS — F988 Other specified behavioral and emotional disorders with onset usually occurring in childhood and adolescence: Secondary | ICD-10-CM

## 2021-04-14 DIAGNOSIS — F411 Generalized anxiety disorder: Secondary | ICD-10-CM

## 2021-04-14 MED ORDER — AMPHETAMINE-DEXTROAMPHETAMINE 20 MG PO TABS: 20.0000 mg | ORAL_TABLET | Freq: Every day | ORAL | 0 refills | Status: DC

## 2021-04-14 NOTE — Progress Notes (Signed)
? ?Subjective:  ? ? Patient ID: Raven Gomez, female    DOB: February 26, 1970, 51 y.o.   MRN: [id number] ? ?Chief Complaint  ?Patient presents with  ? ADHD  ? ? ?HPI ?Patient is in today for f/up on ADHD and anxiety; from appt 01/06/21. ? ?A lot of stress lately with doctor's appointments between herself and her mother. Still hasn't found assisted living for her yet and working on this.  ? ?Did not feel a difference taking Fluoxetine, stopped taking this.  ? ?Counseling started two weeks ago to help her work through boundaries and cognitive changes with her mother. ? ?She is still taking Adderall 20 mg daily occasionally 1/2 tablet in the afternoon.  Just depends on the day and her work schedule.  No side effects or problems with the medication. ? 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 Initially had the extended release, but says it made her feel more sluggish. Does well with current regimen of Adderall 20 mg daily. W/o the medication she does bounce from one project to the next and leaves tasks unfinished. More forgetful without it as well. No side effects. No palpitations or CP. No trouble sleeping. ? ?Past Medical History:  ?Diagnosis Date  ? ADHD   ? Allergy   ? UTI (urinary tract infection)   ? ? ?Past Surgical History:  ?Procedure Laterality Date  ? CESAREAN SECTION    ? CHOLECYSTECTOMY N/A 08/28/2017  ? Procedure: LAPAROSCOPIC CHOLECYSTECTOMY WITH INTRAOPERATIVE CHOLANGIOGRAM;  Surgeon: Alphonsa Overall, MD;  Location: [facility];  Service: General;  Laterality: N/A;  ? ORIF TOE FRACTURE Right 12/11/2018  ? Procedure: RIGHT 4TH TOE PINNING;  Surgeon: Erle Crocker, MD;  Location: [facility];  Service: Orthopedics;  Laterality: Right;  CPT CODE 94496 ?SURGERY REQUEST TIME 30 MINUTES  ? [place] EXTRACTION  2008  ? ? ?Family History  ?Problem Relation Age of Onset  ? Breast cancer Mother 35  ?     12/2014  ? Arthritis Mother   ? Cancer  Mother   ? Depression Mother   ? Hypertension Mother   ? Stroke Mother   ? Cancer Father   ? COPD Father   ? Heart attack Father   ? Hypertension Father   ? Depression Brother   ? Mental illness Brother   ? Cancer Maternal Grandmother   ? Depression Maternal Grandmother   ? Diabetes Maternal Grandmother   ? Mental illness Maternal Grandmother   ? Stroke Maternal Grandfather   ? Heart disease Paternal Grandfather   ? Hypertension Paternal Grandfather   ? Colon cancer Neg Hx   ? Esophageal cancer Neg Hx   ? Stomach cancer Neg Hx   ? Rectal cancer Neg Hx   ? ? ?Social History  ? ?Tobacco Use  ? Smoking status: Never  ? Smokeless tobacco: Never  ?Vaping Use  ? Vaping Use: Never used  ?Substance Use Topics  ? Alcohol use: Yes  ?  Comment: 3-5 glasses of wine per week  ? Drug use: No  ?  ? ?No Known Allergies ? ?Review of Systems ?NEGATIVE UNLESS OTHERWISE INDICATED IN HPI ? ? ?   ?Objective:  ?  ? ?BP 119/76   Pulse 90   Temp 99.5 ?F (37.5 ?C)   Ht '5\' 1"'$  (1.549 m)   Wt 127 lb 4 oz (57.7 kg)   SpO2 100%   BMI 24.04 kg/m?  ? ?Wt Readings from Last 3  Encounters:  ?04/14/21 127 lb 4 oz (57.7 kg)  ?01/06/21 124 lb 4 oz (56.4 kg)  ?07/22/20 127 lb (57.6 kg)  ? ? ?BP Readings from Last 3 Encounters:  ?04/14/21 119/76  ?01/06/21 120/76  ?07/22/20 121/77  ?  ? ?Physical Exam ?Vitals and nursing note reviewed.  ?Constitutional:   ?   General: She is not in acute distress. ?   Appearance: Normal appearance. She is normal weight.  ?HENT:  ?   Head: Normocephalic.  ?   Right Ear: External ear normal.  ?   Left Ear: External ear normal.  ?   Nose: Nose normal.  ?   Mouth/Throat:  ?   Mouth: Mucous membranes are moist.  ?Eyes:  ?   Extraocular Movements: Extraocular movements intact.  ?   Conjunctiva/sclera: Conjunctivae normal.  ?   Pupils: Pupils are equal, round, and reactive to light.  ?Cardiovascular:  ?   Rate and Rhythm: Normal rate and regular rhythm.  ?   Pulses: Normal pulses.  ?   Heart sounds: No murmur  heard. ?Pulmonary:  ?   Effort: Pulmonary effort is normal.  ?   Breath sounds: Normal breath sounds.  ?Musculoskeletal:     ?   General: Normal range of motion.  ?   Cervical back: Normal range of motion.  ?Skin: ?   General: Skin is warm.  ?Neurological:  ?   General: No focal deficit present.  ?   Mental Status: She is alert and oriented to person, place, and time.  ?   Gait: Gait normal.  ?Psychiatric:     ?   Mood and Affect: Mood normal.     ?   Behavior: Behavior normal.  ? ? ?   ?Assessment & Plan:  ? ?Problem List Items Addressed This Visit   ? ?  ? Other  ? GAD (generalized anxiety disorder)  ? ADD (attention deficit disorder) - Primary  ? ? ? ?Meds ordered this encounter  ?Medications  ? amphetamine-dextroamphetamine (ADDERALL) 20 MG tablet  ?  Sig: Take 1 tablet (20 mg total) by mouth daily before breakfast. Take 1/2 tab in pm prn.  ?  Dispense:  45 tablet  ?  Refill:  0  ? ?1. Attention deficit disorder (ADD) without hyperactivity ?PDMP reviewed today, no red flags, filling appropriately.  ?Refilled Adderall accordingly. ?F/up 3 months. ? ?2. GAD (generalized anxiety disorder) ?She did not see a difference taking the fluoxetine 20 mg.  She has since stopped taking this medication.  She is currently undergoing counseling and thinks this is helping.  She does not want any other medication treatment at this time. ? ? ?F/up in 3 months or prn  ? ?Charmin Aguiniga M Audreana Hancox, PA-C ?

## 2021-05-11 ENCOUNTER — Other Ambulatory Visit: Payer: Self-pay | Admitting: Physician Assistant

## 2021-05-11 MED ORDER — AMPHETAMINE-DEXTROAMPHETAMINE 20 MG PO TABS: 20.0000 mg | ORAL_TABLET | Freq: Every day | ORAL | 0 refills | Status: DC

## 2021-05-11 NOTE — Telephone Encounter (Signed)
Last visit: 04/14/21 ? ?Next visit: 07/13/21 ? ?Last filled: 04/14/21 ? ?Quantity: 45  ?

## 2021-05-20 ENCOUNTER — Other Ambulatory Visit: Payer: Self-pay | Admitting: Physician Assistant

## 2021-05-20 MED ORDER — AMPHETAMINE-DEXTROAMPHETAMINE 20 MG PO TABS: 20.0000 mg | ORAL_TABLET | Freq: Every day | ORAL | 0 refills | Status: DC

## 2021-06-18 ENCOUNTER — Other Ambulatory Visit: Payer: Self-pay | Admitting: Physician Assistant

## 2021-06-18 MED ORDER — AMPHETAMINE-DEXTROAMPHETAMINE 20 MG PO TABS: 20.0000 mg | ORAL_TABLET | Freq: Every day | ORAL | 0 refills | Status: DC

## 2021-06-25 ENCOUNTER — Other Ambulatory Visit: Payer: Self-pay | Admitting: Physician Assistant

## 2021-06-25 MED ORDER — AMPHETAMINE-DEXTROAMPHETAMINE 20 MG PO TABS: 20.0000 mg | ORAL_TABLET | Freq: Every day | ORAL | 0 refills | Status: DC

## 2021-07-13 ENCOUNTER — Telehealth (INDEPENDENT_AMBULATORY_CARE_PROVIDER_SITE_OTHER): Payer: Commercial Managed Care - HMO | Admitting: Physician Assistant

## 2021-07-13 ENCOUNTER — Encounter: Payer: Self-pay | Admitting: Physician Assistant

## 2021-07-13 VITALS — Ht 61.0 in | Wt 127.0 lb

## 2021-07-13 DIAGNOSIS — F988 Other specified behavioral and emotional disorders with onset usually occurring in childhood and adolescence: Secondary | ICD-10-CM | POA: Diagnosis not present

## 2021-07-13 NOTE — Progress Notes (Signed)
   Virtual Visit via Video Note  I connected with  Raven Gomez  on 07/13/21 at 10:00 AM EDT by a video enabled telemedicine application and verified that I am speaking with the correct person using two identifiers.  Location: Patient: home Provider: Therapist, music at [place] present: Patient and myself   I discussed the limitations of evaluation and management by telemedicine and the availability of in person appointments. The patient expressed understanding and agreed to proceed.   History of Present Illness:  51 yo female presents for video f/up on ADHD.  -Taking Adderall 20 mg 1 tab am, 1/2 tab afternoon -Doing better taking the 2nd dose consistently daily. -Time management is better. Not running late to work as much and not feeling as frazzled and disorganized. Works as Theme park manager, less stressed.  -Yard work, Education administrator; otherwise no other exercise  -Sleeping well, drinking more water -Still working on finding care for mother; son is going to back to school in the Fall - moving him to [employer] soon    Observations/Objective:   Gen: Awake, alert, no acute distress; adjusting to new oral braces (will have about 6-8 months)  Resp: Breathing is even and non-labored Psych: calm/pleasant demeanor Neuro: Alert and Oriented x 3, + facial symmetry, speech is clear.   Assessment and Plan:  Attention deficit disorder (ADD) without hyperactivity Stable on current medication regimen.  She will continue to take Adderall 20 mg 1 tablet in the morning and 1/2 tablet in the p.m. She is not due for refills yet.  She will reach out through [facility] when her next refill is due. PDMP reviewed today, no red flags, filling appropriately.   Follow-up in 3 months or as needed   Follow Up Instructions:    I discussed the assessment and treatment plan with the patient. The patient was provided an opportunity to ask questions and all were answered. The  patient agreed with the plan and demonstrated an understanding of the instructions.   The patient was advised to call back or seek an in-person evaluation if the symptoms worsen or if the condition fails to improve as anticipated.  Raven Gomez M Alijah Akram, PA-C

## 2021-07-26 ENCOUNTER — Other Ambulatory Visit: Payer: Self-pay | Admitting: Physician Assistant

## 2021-07-27 MED ORDER — AMPHETAMINE-DEXTROAMPHETAMINE 20 MG PO TABS
20.0000 mg | ORAL_TABLET | Freq: Every day | ORAL | 0 refills | Status: DC
Start: 2021-07-27 — End: 2021-08-28

## 2021-07-27 NOTE — Telephone Encounter (Signed)
Last OV: 07/13/21  Next OV: none scheduled  Last filled: 06/25/21  Quantity: 45

## 2021-08-28 ENCOUNTER — Other Ambulatory Visit: Payer: Self-pay | Admitting: Physician Assistant

## 2021-08-28 MED ORDER — AMPHETAMINE-DEXTROAMPHETAMINE 20 MG PO TABS: 20.0000 mg | ORAL_TABLET | Freq: Every day | ORAL | 0 refills | Status: DC

## 2021-08-28 NOTE — Telephone Encounter (Signed)
Last OV: 07/13/21 VV  Next OV: none scheduled  Last filled: 07/27/21  Quantity: 45 tablets

## 2021-09-14 ENCOUNTER — Telehealth: Payer: Self-pay | Admitting: Physician Assistant

## 2021-09-14 NOTE — Telephone Encounter (Signed)
Patient states: -She recently got stung by a bee and was experiencing a rash and swelling in her feet - She wanted to know if she could get worked before she went to the UC/ ED  Due to patient calling at 4:30, there were no other availabilities with any providers. She stated she will continue with her original plan of going to the UC.

## 2021-09-15 NOTE — Telephone Encounter (Signed)
Pt went to Urgent care.

## 2021-09-15 NOTE — Telephone Encounter (Signed)
Patient Name: Raven Gomez Gender: Female DOB: 06-07-1970 Age: 51 Y 3 M 2 D Return Phone Number: [phone] (Primary) Address: City/ State/ Zip: [place]  [address] Client [name] Client Site [name] Paediatric nurse, Freight forwarder- PA Contact Type Call Who Is Calling Patient / Member / Family / Caregiver Call Type Triage / Clinical Caller Name [name] Relationship To Patient Other Return Phone Number [phone] (Primary) Chief Complaint Swelling (generalized) Reason for Call Symptomatic / Request for [place] states patient got sting by been on her belly button and also ankle she said the swelling from her belly button is traveling to her stomach area and vaginal area and she said the ankle sting hasn't been giving her problems with swelling but she can barely move her leg. Translation No Nurse Assessment Nurse: Leilani Merl, RN, Heather Date/Time (Eastern Time): 09/15/2021 1:37:12 PM Confirm and document reason for call. If symptomatic, describe symptoms. ---Caller states that she was stung 2 times by a yellow jacket, once on her stomach and once on her ankle. She was stung on Sunday. Does the patient have any new or worsening symptoms? ---Yes Will a triage be completed? ---Yes Related visit to physician within the last 2 weeks? ---No Does the PT have any chronic conditions? (i.e. diabetes, asthma, this includes High risk factors for pregnancy, etc.) ---No Is the patient pregnant or possibly pregnant? (Ask all females between the ages of 60-55) ---No Is this a behavioral health or substance abuse call? ---No PLEASE NOTE: All timestamps contained within this report are represented as [place] Standard Time. CONFIDENTIALTY NOTICE: This fax transmission is intended only for the addressee. It contains information that is legally privileged, confidential  or otherwise protected from use or disclosure. If you are not the intended recipient, you are strictly prohibited from reviewing, disclosing, copying using or disseminating any of this information or taking any action in reliance on or regarding this information. If you have received this fax in error, please notify us immediately by telephone so that we can arrange for its return to [place]. Phone: [phone], Toll-Free: [phone], Fax: [phone] Page: 2 of 2 Call Id: [id number] Guidelines Guideline Title Affirmed Question Affirmed Notes Nurse Date/Time Eilene Ghazi Time) Bee or Yellow Jacket Sting Patient sounds very sick or weak to the triager [employer], RN, [facility] 09/15/2021 1:37:46 PM Disp. Time Eilene Ghazi Time) Disposition Final User 09/15/2021 1:43:41 PM Go to ED Now (or PCP triage) Yes Standifer, RN, Nira Conn Final Disposition 09/15/2021 1:43:41 PM Go to ED Now (or PCP triage) Yes Standifer, RN, Soyla Murphy Disagree/Comply Comply Caller Understands Yes PreDisposition Call Doctor Care Advice Given Per Guideline GO TO ED NOW (OR PCP TRIAGE): CARE ADVICE given per Bee or Yellow Jacket Sting (Adult) guideline. Referrals GO TO FACILITY UNDECIDED

## 2021-09-15 NOTE — Telephone Encounter (Signed)
Patient states: - Following phone call on 08/07 she did not go to the UC/ED as planned.  - Her bee sting she has on her belly button has swelling,redness, and hives along with it.  - It appears that the hives and swelling has traveled down to her pubic bone and leg.  - She has applied topical antihistamines and take advil and tylenol but nothing has helped.  - She also got stung on her ankle and although it is swollen the swelling has not traveled further   Due to symptoms I sent pt to triage for further evaluation.

## 2021-09-28 ENCOUNTER — Other Ambulatory Visit: Payer: Self-pay | Admitting: Physician Assistant

## 2021-09-28 MED ORDER — AMPHETAMINE-DEXTROAMPHETAMINE 20 MG PO TABS: 20.0000 mg | ORAL_TABLET | Freq: Every day | ORAL | 0 refills | Status: DC

## 2021-10-13 ENCOUNTER — Ambulatory Visit (INDEPENDENT_AMBULATORY_CARE_PROVIDER_SITE_OTHER): Payer: Commercial Managed Care - HMO | Admitting: Physician Assistant

## 2021-10-13 ENCOUNTER — Encounter: Payer: Self-pay | Admitting: Physician Assistant

## 2021-10-13 VITALS — BP 108/74 | HR 97 | Temp 98.7°F | Ht 61.0 in | Wt 118.0 lb

## 2021-10-13 DIAGNOSIS — R634 Abnormal weight loss: Secondary | ICD-10-CM | POA: Diagnosis not present

## 2021-10-13 DIAGNOSIS — F988 Other specified behavioral and emotional disorders with onset usually occurring in childhood and adolescence: Secondary | ICD-10-CM | POA: Diagnosis not present

## 2021-10-13 LAB — COMPREHENSIVE METABOLIC PANEL
ALT: 12 U/L (ref 0–35)
AST: 15 U/L (ref 0–37)
Albumin: 4.2 g/dL (ref 3.5–5.2)
Alkaline Phosphatase: 60 U/L (ref 39–117)
BUN: 12 mg/dL (ref 6–23)
CO2: 25 mEq/L (ref 19–32)
Calcium: 9.1 mg/dL (ref 8.4–10.5)
Chloride: 104 mEq/L (ref 96–112)
Creatinine, Ser: 0.75 mg/dL (ref 0.40–1.20)
GFR: 91.38 mL/min (ref >60.00)
Glucose, Bld: 96 mg/dL (ref 70–99)
Potassium: 3.7 mEq/L (ref 3.5–5.1)
Sodium: 137 mEq/L (ref 135–145)
Total Bilirubin: 0.9 mg/dL (ref 0.2–1.2)
Total Protein: 7 g/dL (ref 6.0–8.3)

## 2021-10-13 LAB — CBC WITH DIFFERENTIAL/PLATELET
Basophils Absolute: 0 10*3/uL (ref 0.0–0.1)
Basophils Relative: 0.1 % (ref 0.0–3.0)
Eosinophils Absolute: 0 10*3/uL (ref 0.0–0.7)
Eosinophils Relative: 0.4 % (ref 0.0–5.0)
HCT: 38.5 % (ref 36.0–46.0)
Hemoglobin: 13.2 g/dL (ref 12.0–15.0)
Lymphocytes Relative: 13.1 % (ref 12.0–46.0)
Lymphs Abs: 0.9 10*3/uL (ref 0.7–4.0)
MCHC: 34.4 g/dL (ref 30.0–36.0)
MCV: 97.9 fl (ref 78.0–100.0)
Monocytes Absolute: 0.7 10*3/uL (ref 0.1–1.0)
Monocytes Relative: 10.3 % (ref 3.0–12.0)
Neutro Abs: 5.1 10*3/uL (ref 1.4–7.7)
Neutrophils Relative %: 76.1 % (ref 43.0–77.0)
Platelets: 213 10*3/uL (ref 150.0–400.0)
RBC: 3.93 Mil/uL (ref 3.87–5.11)
RDW: 12.6 % (ref 11.5–15.5)
WBC: 6.8 10*3/uL (ref 4.0–10.5)

## 2021-10-13 LAB — TSH: TSH: 0.92 u[IU]/mL (ref 0.35–5.50)

## 2021-10-13 NOTE — Assessment & Plan Note (Signed)
Stable Adderall 20 mg 1 tab qAM and 1/2 tab afternoon PDMP reviewed today, no red flags, filling appropriately.  Will call for refills.  

## 2021-10-13 NOTE — Progress Notes (Signed)
Subjective:    Patient ID: Raven Gomez, female    DOB: 27-May-1970, 51 y.o.   MRN: [id number]  Chief Complaint  Patient presents with   Follow-up    Pt in for 3 mon f/u; pt states no concerns to discuss otherwise and all has been going well;     HPI Patient is in today for 3 month regular f/up on ADHD management. Taking Adderall as prescribed, no new concerns.  Appetite good, just got braces recently. Doesn't feel stressed. Seeing a counselor recently as well - feels tired when she gets off work and she suggested taking the second dose of Adderall daily consistently. Cooking, staying motivated and less tired with second dose of adderall. Did not realize she has lost almost 10 lbs since last visit.    Past Medical History:  Diagnosis Date   ADHD    Allergy    UTI (urinary tract infection)     Past Surgical History:  Procedure Laterality Date   CESAREAN SECTION     CHOLECYSTECTOMY N/A 08/28/2017   Procedure: LAPAROSCOPIC CHOLECYSTECTOMY WITH INTRAOPERATIVE CHOLANGIOGRAM;  Surgeon: Alphonsa Overall, MD;  [facility]: [facility];  Service: General;  Laterality: N/A;   ORIF TOE FRACTURE Right 12/11/2018   Procedure: RIGHT 4TH TOE PINNING;  Surgeon: Erle Crocker, MD;  Location: [facility];  Service: Orthopedics;  Laterality: Right;  CPT CODE 69485 SURGERY REQUEST TIME 63 MINUTES   WISDOM TOOTH EXTRACTION  2008    Family History  Problem Relation Age of Onset   Breast cancer Mother 81       12/2014   Arthritis Mother    Cancer Mother    Depression Mother    Hypertension Mother    Stroke Mother    Cancer Father    COPD Father    Heart attack Father    Hypertension Father    Depression Brother    Mental illness Brother    Cancer Maternal Grandmother    Depression Maternal Grandmother    Diabetes Maternal Grandmother    Mental illness Maternal Grandmother    Stroke Maternal Grandfather    Heart disease Paternal Grandfather    Hypertension Paternal  Grandfather    Colon cancer Neg Hx    Esophageal cancer Neg Hx    Stomach cancer Neg Hx    Rectal cancer Neg Hx     Social History   Tobacco Use   Smoking status: Never   Smokeless tobacco: Never  Vaping Use   Vaping Use: Never used  Substance Use Topics   Alcohol use: Yes    Comment: 3-5 glasses of wine per week   Drug use: No     No Known Allergies  Review of Systems NEGATIVE UNLESS OTHERWISE INDICATED IN HPI      Objective:     BP 108/74 (BP Location: Right Arm)   Pulse 97   Temp 98.7 F (37.1 C) (Temporal)   Ht '5\' 1"'$  (1.549 m)   Wt 118 lb (53.5 kg)   LMP 10/03/2021 (Approximate)   SpO2 98%   BMI 22.30 kg/m   Wt Readings from Last 3 Encounters:  10/13/21 118 lb (53.5 kg)  07/13/21 127 lb (57.6 kg)  04/14/21 127 lb 4 oz (57.7 kg)    BP Readings from Last 3 Encounters:  10/13/21 108/74  04/14/21 119/76  01/06/21 120/76     Physical Exam Vitals and nursing note reviewed.  Constitutional:      Appearance: Normal appearance. She is  normal weight. She is not toxic-appearing.  HENT:     Head: Normocephalic and atraumatic.     Right Ear: Tympanic membrane, ear canal and external ear normal.     Left Ear: Tympanic membrane, ear canal and external ear normal.     Nose: Nose normal.     Mouth/Throat:     Mouth: Mucous membranes are moist.  Eyes:     Extraocular Movements: Extraocular movements intact.     Conjunctiva/sclera: Conjunctivae normal.     Pupils: Pupils are equal, round, and reactive to light.  Cardiovascular:     Rate and Rhythm: Normal rate and regular rhythm.     Pulses: Normal pulses.     Heart sounds: Normal heart sounds.  Pulmonary:     Effort: Pulmonary effort is normal.     Breath sounds: Normal breath sounds.  Abdominal:     General: Abdomen is flat. Bowel sounds are normal. There is no distension.     Palpations: Abdomen is soft. There is no mass.     Tenderness: There is no abdominal tenderness. There is no right CVA  tenderness or left CVA tenderness.     Hernia: No hernia is present.  Musculoskeletal:        General: Normal range of motion.     Cervical back: Normal range of motion and neck supple.  Skin:    General: Skin is warm and dry.  Neurological:     General: No focal deficit present.     Mental Status: She is alert and oriented to person, place, and time.  Psychiatric:        Mood and Affect: Mood normal.        Behavior: Behavior normal.        Thought Content: Thought content normal.        Judgment: Judgment normal.        Assessment & Plan:  Attention deficit disorder (ADD) without hyperactivity Assessment & Plan: Stable Adderall 20 mg 1 tab qAM and 1/2 tab afternoon PDMP reviewed today, no red flags, filling appropriately.  Will call for refills.    Abnormal weight loss -     CBC with Differential/Platelet -     TSH -     Comprehensive metabolic panel  Check labs, treat pending results, monitor weight, call if any changes   Return in about 3 months (around 01/12/2022) for recheck.   Geoffrey Mankin M Monserratt Knezevic, PA-C

## 2021-10-13 NOTE — Patient Instructions (Addendum)
Great to see you today!  Let's check labs, treat pending results. Monitor weight once weekly at home. Call if any changes / concerns.  Keep medication regimen the same.

## 2021-10-15 ENCOUNTER — Encounter: Payer: Self-pay | Admitting: Physician Assistant

## 2021-11-03 ENCOUNTER — Other Ambulatory Visit: Payer: Self-pay | Admitting: Physician Assistant

## 2021-11-03 MED ORDER — AMPHETAMINE-DEXTROAMPHETAMINE 20 MG PO TABS: 20.0000 mg | ORAL_TABLET | Freq: Every day | ORAL | 0 refills | Status: DC

## 2021-11-10 ENCOUNTER — Other Ambulatory Visit: Payer: Self-pay | Admitting: Physician Assistant

## 2021-11-10 NOTE — Telephone Encounter (Signed)
Please refuse medication you sent it in on 11/03/2021.

## 2021-11-11 ENCOUNTER — Telehealth: Payer: Self-pay | Admitting: Physician Assistant

## 2021-11-11 MED ORDER — AMPHETAMINE-DEXTROAMPHETAMINE 20 MG PO TABS: 20.0000 mg | ORAL_TABLET | Freq: Every day | ORAL | 0 refills | Status: DC

## 2021-11-11 NOTE — Telephone Encounter (Signed)
Last OV: 10/13/21  Next OV:01/11/22  Last filled: 11/03/21  Quantity: 45 tablets

## 2021-11-17 ENCOUNTER — Other Ambulatory Visit: Payer: Self-pay | Admitting: Physician Assistant

## 2021-11-17 MED ORDER — AMPHETAMINE-DEXTROAMPHETAMINE 10 MG PO TABS: ORAL_TABLET | ORAL | 0 refills | Status: DC

## 2021-11-17 NOTE — Telephone Encounter (Signed)
Pt states: -pharmacy does not have supply of '20mg'$  medication -pharmacy does have '10mg'$  supply   Pt requests: -PCP team to rewrite prescription for '10mg'$    Pharmacy: [facility] [place], [place] - [address] AT [facility] [place], [place] [address] Phone: [phone]  Fax: [phone] DEA #: [id number]

## 2021-11-17 NOTE — Telephone Encounter (Signed)
Please see pt call note and request

## 2021-11-17 NOTE — Telephone Encounter (Signed)
Returned pt call and lvm with provider instructions and cb number if needing to call back with questions or concerns.

## 2021-11-18 ENCOUNTER — Telehealth: Payer: Self-pay

## 2021-11-18 NOTE — Telephone Encounter (Signed)
Raven Gomez Key: [id number] -  PA Case ID: [id number] Status Sent to Plan today Drug Amphetamine-Dextroamphetamine '10MG'$  tablets Awaiting determination

## 2021-12-16 ENCOUNTER — Other Ambulatory Visit: Payer: Self-pay | Admitting: Physician Assistant

## 2021-12-16 MED ORDER — AMPHETAMINE-DEXTROAMPHETAMINE 20 MG PO TABS: 20.0000 mg | ORAL_TABLET | Freq: Every day | ORAL | 0 refills | Status: DC

## 2022-01-11 ENCOUNTER — Ambulatory Visit (INDEPENDENT_AMBULATORY_CARE_PROVIDER_SITE_OTHER): Payer: Commercial Managed Care - HMO | Admitting: Physician Assistant

## 2022-01-11 ENCOUNTER — Encounter: Payer: Self-pay | Admitting: Physician Assistant

## 2022-01-11 VITALS — BP 114/82 | HR 82 | Temp 97.8°F | Ht 61.0 in | Wt 118.0 lb

## 2022-01-11 DIAGNOSIS — F988 Other specified behavioral and emotional disorders with onset usually occurring in childhood and adolescence: Secondary | ICD-10-CM

## 2022-01-11 MED ORDER — AMPHETAMINE-DEXTROAMPHETAMINE 20 MG PO TABS: 20.0000 mg | ORAL_TABLET | Freq: Every day | ORAL | 0 refills | Status: DC

## 2022-01-11 NOTE — Assessment & Plan Note (Signed)
Stable Adderall 20 mg 1 tab qAM and 1/2 tab afternoon PDMP reviewed today, no red flags, filling appropriately.  Will call for refills.  Encouraged her to work on increasing exercise at home via YouTube video or other free site with exercises.

## 2022-01-11 NOTE — Progress Notes (Signed)
Subjective:    Patient ID: Raven Gomez, female    DOB: 02-03-1971, 51 y.o.   MRN: [id number]  Chief Complaint  Patient presents with   Medication Refill    Pt in for medication refill on ADHD medication; no other concerns, per pt all is well    Medication Refill   Patient is in today for ADHD recheck.  No major medical changes since last visit.  States that she has been feeling well.  Trying to work on increasing her exercise.   Past Medical History:  Diagnosis Date   ADHD    Allergy    UTI (urinary tract infection)     Past Surgical History:  Procedure Laterality Date   CESAREAN SECTION     CHOLECYSTECTOMY N/A 08/28/2017   Procedure: LAPAROSCOPIC CHOLECYSTECTOMY WITH INTRAOPERATIVE CHOLANGIOGRAM;  Surgeon: Alphonsa Overall, MD;  Location: [facility];  Service: General;  Laterality: N/A;   ORIF TOE FRACTURE Right 12/11/2018   Procedure: RIGHT 4TH TOE PINNING;  Surgeon: Erle Crocker, MD;  Location: [facility];  Service: Orthopedics;  Laterality: Right;  CPT CODE 24401 SURGERY REQUEST TIME 65 MINUTES   WISDOM TOOTH EXTRACTION  2008    Family History  Problem Relation Age of Onset   Breast cancer Mother 11       12/2014   Arthritis Mother    Cancer Mother    Depression Mother    Hypertension Mother    Stroke Mother    Cancer Father    COPD Father    Heart attack Father    Hypertension Father    Depression Brother    Mental illness Brother    Cancer Maternal Grandmother    Depression Maternal Grandmother    Diabetes Maternal Grandmother    Mental illness Maternal Grandmother    Stroke Maternal Grandfather    Heart disease Paternal Grandfather    Hypertension Paternal Grandfather    Colon cancer Neg Hx    Esophageal cancer Neg Hx    Stomach cancer Neg Hx    Rectal cancer Neg Hx     Social History   Tobacco Use   Smoking status: Never   Smokeless tobacco: Never  Vaping Use   Vaping Use: Never used  Substance Use Topics   Alcohol  use: Yes    Comment: 3-5 glasses of wine per week   Drug use: No     No Known Allergies  Review of Systems NEGATIVE UNLESS OTHERWISE INDICATED IN HPI      Objective:     BP 114/82 (BP Location: Left Arm)   Pulse 82   Temp 97.8 F (36.6 C) (Temporal)   Ht '5\' 1"'$  (1.549 m)   Wt 118 lb (53.5 kg)   LMP 01/04/2022 (Exact Date)   SpO2 97%   BMI 22.30 kg/m   Wt Readings from Last 3 Encounters:  01/11/22 118 lb (53.5 kg)  10/13/21 118 lb (53.5 kg)  07/13/21 127 lb (57.6 kg)    BP Readings from Last 3 Encounters:  01/11/22 114/82  10/13/21 108/74  04/14/21 119/76     Physical Exam Vitals and nursing note reviewed.  Constitutional:      Appearance: Normal appearance.  Cardiovascular:     Rate and Rhythm: Normal rate and regular rhythm.  Pulmonary:     Effort: Pulmonary effort is normal.  Neurological:     General: No focal deficit present.     Mental Status: She is alert and oriented to person, place,  and time.  Psychiatric:        Mood and Affect: Mood normal.        Assessment & Plan:  Attention deficit disorder (ADD) without hyperactivity Assessment & Plan: Stable Adderall 20 mg 1 tab qAM and 1/2 tab afternoon PDMP reviewed today, no red flags, filling appropriately.  Will call for refills.  Encouraged her to work on increasing exercise at home via YouTube video or other free site with exercises.     Other orders -     Amphetamine-Dextroamphetamine; Take 1 tablet (20 mg total) by mouth daily before breakfast. Take 1/2 tab in pm prn.  Dispense: 45 tablet; Refill: 0        Return in about 3 months (around 04/12/2022) for med recheck .  This note was prepared with assistance of Systems analyst. Occasional wrong-word or sound-a-like substitutions may have occurred due to the inherent limitations of voice recognition software.     Yardley Beltran M Jomel Whittlesey, PA-C

## 2022-01-14 ENCOUNTER — Other Ambulatory Visit: Payer: Self-pay | Admitting: Physician Assistant

## 2022-01-14 MED ORDER — AMPHETAMINE-DEXTROAMPHETAMINE 20 MG PO TABS: 20.0000 mg | ORAL_TABLET | Freq: Every day | ORAL | 0 refills | Status: DC

## 2022-02-16 ENCOUNTER — Other Ambulatory Visit: Payer: Self-pay | Admitting: Physician Assistant

## 2022-02-17 MED ORDER — AMPHETAMINE-DEXTROAMPHETAMINE 20 MG PO TABS: 20.0000 mg | ORAL_TABLET | Freq: Every day | ORAL | 0 refills | Status: DC

## 2022-02-17 NOTE — Telephone Encounter (Signed)
Last OV: 01/16/22  Next OV: 04/15/22  Last filled: 01/16/22  Quantity: 90 w/ 0 refills

## 2022-02-17 NOTE — Telephone Encounter (Signed)
Last OV: 01/11/22  Next OV: 04/15/22  Last filled: 01/16/22  Quantity: 90 w/ 0 refills

## 2022-03-07 LAB — HM MAMMOGRAPHY

## 2022-03-21 ENCOUNTER — Other Ambulatory Visit: Payer: Self-pay | Admitting: Physician Assistant

## 2022-03-22 MED ORDER — AMPHETAMINE-DEXTROAMPHETAMINE 20 MG PO TABS: 20.0000 mg | ORAL_TABLET | Freq: Every day | ORAL | 0 refills | Status: DC

## 2022-03-22 NOTE — Telephone Encounter (Signed)
Last OV: 01/11/22  Next OV: 04/15/22  Last filled: 02/17/22  Quantity: 45

## 2022-04-15 ENCOUNTER — Encounter: Payer: Self-pay | Admitting: Physician Assistant

## 2022-04-15 ENCOUNTER — Ambulatory Visit (INDEPENDENT_AMBULATORY_CARE_PROVIDER_SITE_OTHER): Payer: Commercial Managed Care - HMO | Admitting: Physician Assistant

## 2022-04-15 VITALS — BP 118/78 | HR 88 | Temp 97.8°F | Ht 61.0 in | Wt 115.8 lb

## 2022-04-15 DIAGNOSIS — F988 Other specified behavioral and emotional disorders with onset usually occurring in childhood and adolescence: Secondary | ICD-10-CM

## 2022-04-15 DIAGNOSIS — M19042 Primary osteoarthritis, left hand: Secondary | ICD-10-CM | POA: Insufficient documentation

## 2022-04-15 DIAGNOSIS — M19041 Primary osteoarthritis, right hand: Secondary | ICD-10-CM | POA: Insufficient documentation

## 2022-04-15 MED ORDER — MELOXICAM 15 MG PO TABS: 15.0000 mg | ORAL_TABLET | Freq: Every day | ORAL | 3 refills | Status: AC

## 2022-04-15 NOTE — Assessment & Plan Note (Signed)
Stable Adderall 20 mg 1 tab qAM and 1/2 tab afternoon PDMP reviewed today, no red flags, filling appropriately.  Will call for refills.

## 2022-04-15 NOTE — Assessment & Plan Note (Signed)
Patient has been stable on meloxicam 15 mg daily.  I will take over this medication for her at this time.  She is aware of possible risk versus benefits.  She is aware of possible GI bleeding and cardiovascular complications, will monitor.

## 2022-04-15 NOTE — Progress Notes (Signed)
Subjective:    Patient ID: Raven Gomez, female    DOB: 1971/01/25, 52 y.o.   MRN: [id number]  Chief Complaint  Patient presents with   Follow-up    Pt in for 3 mon f/u and med check/refill; Mom recently passed away and been dealing with a lot but starting to feel better; pt needs to discuss Meloxicam and getting script moved to PCP office;     HPI Patient is in today for 3 month med check.  She is doing well.  States that her mother passed away about 3 months ago from pneumonia and a stroke, but seems to be handling things well.  She works full-time as a Theme park manager.  She has osteoarthritis in her hands.  She is needing meloxicam refilled.  She just recently refilled her Adderall. No side effects or other concerns.   Past Medical History:  Diagnosis Date   ADHD    Allergy    UTI (urinary tract infection)     Past Surgical History:  Procedure Laterality Date   CESAREAN SECTION     CHOLECYSTECTOMY N/A 08/28/2017   Procedure: LAPAROSCOPIC CHOLECYSTECTOMY WITH INTRAOPERATIVE CHOLANGIOGRAM;  Surgeon: Alphonsa Overall, MD;  Location: [facility];  Service: General;  Laterality: N/A;   ORIF TOE FRACTURE Right 12/11/2018   Procedure: RIGHT 4TH TOE PINNING;  Surgeon: Erle Crocker, MD;  Location: [facility];  Service: Orthopedics;  Laterality: Right;  CPT CODE 32440 SURGERY REQUEST TIME 78 MINUTES   WISDOM TOOTH EXTRACTION  2008    Family History  Problem Relation Age of Onset   Breast cancer Mother 52       12/2014   Arthritis Mother    Cancer Mother    Depression Mother    Hypertension Mother    Stroke Mother    Cancer Father    COPD Father    Heart attack Father    Hypertension Father    Depression Brother    Mental illness Brother    Cancer Maternal Grandmother    Depression Maternal Grandmother    Diabetes Maternal Grandmother    Mental illness Maternal Grandmother    Stroke Maternal Grandfather    Heart disease Paternal Grandfather    Hypertension  Paternal Grandfather    Colon cancer Neg Hx    Esophageal cancer Neg Hx    Stomach cancer Neg Hx    Rectal cancer Neg Hx     Social History   Tobacco Use   Smoking status: Never   Smokeless tobacco: Never  Vaping Use   Vaping Use: Never used  Substance Use Topics   Alcohol use: Yes    Comment: 3-5 glasses of wine per week   Drug use: No     No Known Allergies  Review of Systems NEGATIVE UNLESS OTHERWISE INDICATED IN HPI      Objective:     BP 118/78 (BP Location: Left Arm)   Pulse 88   Temp 97.8 F (36.6 C) (Temporal)   Ht '5\' 1"'$  (1.549 m)   Wt 115 lb 12.8 oz (52.5 kg)   LMP 04/11/2022 (Exact Date)   SpO2 99%   BMI 21.88 kg/m   Wt Readings from Last 3 Encounters:  04/15/22 115 lb 12.8 oz (52.5 kg)  01/11/22 118 lb (53.5 kg)  10/13/21 118 lb (53.5 kg)    BP Readings from Last 3 Encounters:  04/15/22 118/78  01/11/22 114/82  10/13/21 108/74     Physical Exam Vitals and nursing note reviewed.  Constitutional:      Appearance: Normal appearance.  Eyes:     Extraocular Movements: Extraocular movements intact.     Conjunctiva/sclera: Conjunctivae normal.     Pupils: Pupils are equal, round, and reactive to light.  Cardiovascular:     Rate and Rhythm: Normal rate and regular rhythm.  Pulmonary:     Effort: Pulmonary effort is normal.     Breath sounds: Normal breath sounds.  Skin:    General: Skin is warm.  Neurological:     General: No focal deficit present.     Mental Status: She is alert and oriented to person, place, and time.  Psychiatric:        Mood and Affect: Mood normal.        Behavior: Behavior normal.        Assessment & Plan:  Attention deficit disorder (ADD) without hyperactivity Assessment & Plan: Stable Adderall 20 mg 1 tab qAM and 1/2 tab afternoon PDMP reviewed today, no red flags, filling appropriately.  Will call for refills.    Primary osteoarthritis of right hand Assessment & Plan: Patient has been stable on  meloxicam 15 mg daily.  I will take over this medication for her at this time.  She is aware of possible risk versus benefits.  She is aware of possible GI bleeding and cardiovascular complications, will monitor.    Other orders -     Meloxicam; Take 1 tablet (15 mg total) by mouth daily. Take with food for arthritis pain.  Dispense: 90 tablet; Refill: 3        Return in about 3 months (around 07/16/2022) for med check - virtual or -in-person OK .  This note was prepared with assistance of Systems analyst. Occasional wrong-word or sound-a-like substitutions may have occurred due to the inherent limitations of voice recognition software.    Jermiah Soderman M Avy Barlett, PA-C

## 2022-04-20 ENCOUNTER — Other Ambulatory Visit: Payer: Self-pay | Admitting: Physician Assistant

## 2022-04-21 MED ORDER — AMPHETAMINE-DEXTROAMPHETAMINE 20 MG PO TABS: 20.0000 mg | ORAL_TABLET | Freq: Every day | ORAL | 0 refills | Status: DC

## 2022-04-21 NOTE — Telephone Encounter (Signed)
Pt requesting refill for Adderall 20 mg. Last OV 04/15/2022, scheduled for 07/19/2022.

## 2022-05-21 ENCOUNTER — Other Ambulatory Visit: Payer: Self-pay | Admitting: Physician Assistant

## 2022-05-21 MED ORDER — AMPHETAMINE-DEXTROAMPHETAMINE 20 MG PO TABS: 20.0000 mg | ORAL_TABLET | Freq: Every day | ORAL | 0 refills | Status: DC

## 2022-05-21 NOTE — Telephone Encounter (Signed)
Last OV: 04/15/22  Next OV: 07/19/22  Last Filled: 04/21/22  Quantity: 45 w/ 0 refills

## 2022-06-21 ENCOUNTER — Other Ambulatory Visit: Payer: Self-pay | Admitting: Physician Assistant

## 2022-06-21 MED ORDER — AMPHETAMINE-DEXTROAMPHETAMINE 20 MG PO TABS: 20.0000 mg | ORAL_TABLET | Freq: Every day | ORAL | 0 refills | Status: DC

## 2022-06-21 NOTE — Telephone Encounter (Signed)
Last refill: 05/21/22 #45, 0 Last OV: 04/15/22 dx. ADHD

## 2022-07-19 ENCOUNTER — Telehealth (INDEPENDENT_AMBULATORY_CARE_PROVIDER_SITE_OTHER): Payer: Commercial Managed Care - HMO | Admitting: Physician Assistant

## 2022-07-19 VITALS — Ht 61.0 in | Wt 115.0 lb

## 2022-07-19 DIAGNOSIS — F988 Other specified behavioral and emotional disorders with onset usually occurring in childhood and adolescence: Secondary | ICD-10-CM

## 2022-07-19 MED ORDER — AMPHETAMINE-DEXTROAMPHETAMINE 20 MG PO TABS: 20.0000 mg | ORAL_TABLET | Freq: Every day | ORAL | 0 refills | Status: DC

## 2022-07-19 NOTE — Progress Notes (Signed)
   Virtual Visit via Video Note  I connected with  Raven Gomez  on 07/19/22 at 10:00 AM EDT by a video enabled telemedicine application and verified that I am speaking with the correct person using two identifiers.  Location: Patient: home Provider: Nature conservation officer at [employer] Persons present: Patient and myself   I discussed the limitations of evaluation and management by telemedicine and the availability of in person appointments. The patient expressed understanding and agreed to proceed.   History of Present Illness:  52 yo female presenting for VV f/up on ADHD. See A/P for details.    Observations/Objective:   Gen: Awake, alert, no acute distress Resp: Breathing is even and non-labored Psych: calm/pleasant demeanor Neuro: Alert and Oriented x 3, + facial symmetry, speech is clear.   Assessment and Plan:  Problem List Items Addressed This Visit       Other   Attention deficit disorder (ADD) without hyperactivity - Primary    Stable, chronic Adderall 20 mg 1 tab qAM and 1/2 tab afternoon PDMP reviewed today, no red flags, filling appropriately.  Refills sent x 3 months. F/up in 3 months or prn.         Follow Up Instructions:    I discussed the assessment and treatment plan with the patient. The patient was provided an opportunity to ask questions and all were answered. The patient agreed with the plan and demonstrated an understanding of the instructions.   The patient was advised to call back or seek an in-person evaluation if the symptoms worsen or if the condition fails to improve as anticipated.  Kaegan Stigler M Odester Nilson, PA-C

## 2022-07-19 NOTE — Assessment & Plan Note (Signed)
Stable, chronic Adderall 20 mg 1 tab qAM and 1/2 tab afternoon PDMP reviewed today, no red flags, filling appropriately.  Refills sent x 3 months. F/up in 3 months or prn.

## 2022-07-20 ENCOUNTER — Encounter: Payer: Self-pay | Admitting: Obstetrics & Gynecology

## 2022-08-25 ENCOUNTER — Other Ambulatory Visit: Payer: Self-pay | Admitting: Physician Assistant

## 2022-08-25 MED ORDER — AMPHETAMINE-DEXTROAMPHETAMINE 20 MG PO TABS: 20.0000 mg | ORAL_TABLET | Freq: Every day | ORAL | 0 refills | Status: DC

## 2022-09-14 ENCOUNTER — Emergency Department (HOSPITAL_BASED_OUTPATIENT_CLINIC_OR_DEPARTMENT_OTHER): Payer: Commercial Managed Care - HMO

## 2022-09-14 ENCOUNTER — Encounter (HOSPITAL_BASED_OUTPATIENT_CLINIC_OR_DEPARTMENT_OTHER): Payer: Self-pay | Admitting: Emergency Medicine

## 2022-09-14 ENCOUNTER — Emergency Department (HOSPITAL_BASED_OUTPATIENT_CLINIC_OR_DEPARTMENT_OTHER)
Admission: EM | Admit: 2022-09-14 | Discharge: 2022-09-14 | Disposition: A | Discharge status: Home / Self Care | Payer: Commercial Managed Care - HMO | Attending: Emergency Medicine | Admitting: Emergency Medicine

## 2022-09-14 ENCOUNTER — Other Ambulatory Visit: Payer: Self-pay

## 2022-09-14 DIAGNOSIS — N939 Abnormal uterine and vaginal bleeding, unspecified: Secondary | ICD-10-CM | POA: Diagnosis present

## 2022-09-14 DIAGNOSIS — R102 Pelvic and perineal pain: Secondary | ICD-10-CM

## 2022-09-14 LAB — CBC WITH DIFFERENTIAL/PLATELET
Abs Immature Granulocytes: 0.01 10*3/uL (ref 0.00–0.07)
Basophils Absolute: 0 10*3/uL (ref 0.0–0.1)
Basophils Relative: 0 %
Eosinophils Absolute: 0 10*3/uL (ref 0.0–0.5)
Eosinophils Relative: 1 %
HCT: 40.6 % (ref 36.0–46.0)
Hemoglobin: 13.8 g/dL (ref 12.0–15.0)
Immature Granulocytes: 0 %
Lymphocytes Relative: 28 %
Lymphs Abs: 1.5 10*3/uL (ref 0.7–4.0)
MCH: 33.3 pg (ref 26.0–34.0)
MCHC: 34 g/dL (ref 30.0–36.0)
MCV: 98.1 fL (ref 80.0–100.0)
Monocytes Absolute: 0.5 10*3/uL (ref 0.1–1.0)
Monocytes Relative: 10 %
Neutro Abs: 3.1 10*3/uL (ref 1.7–7.7)
Neutrophils Relative %: 61 %
Platelets: 247 10*3/uL (ref 150–400)
RBC: 4.14 MIL/uL (ref 3.87–5.11)
RDW: 13.1 % (ref 11.5–15.5)
WBC: 5.2 10*3/uL (ref 4.0–10.5)
nRBC: 0 % (ref 0.0–0.2)

## 2022-09-14 LAB — BASIC METABOLIC PANEL
Anion gap: 9 (ref 5–15)
BUN: 12 mg/dL (ref 6–20)
CO2: 23 mmol/L (ref 22–32)
Calcium: 9.2 mg/dL (ref 8.9–10.3)
Chloride: 103 mmol/L (ref 98–111)
Creatinine, Ser: 0.64 mg/dL (ref 0.44–1.00)
GFR, Estimated: >60 mL/min (ref >60)
Glucose, Bld: 95 mg/dL (ref 70–99)
Potassium: 4.6 mmol/L (ref 3.5–5.1)
Sodium: 135 mmol/L (ref 135–145)

## 2022-09-14 LAB — HCG, SERUM, QUALITATIVE: Preg, Serum: NEGATIVE

## 2022-09-14 MED ORDER — KETOROLAC TROMETHAMINE 30 MG/ML IJ SOLN
30.0000 mg | Freq: Once | INTRAMUSCULAR | Status: AC
  Administered 2022-09-14: 30 mg via INTRAVENOUS
  Filled 2022-09-14: qty 1

## 2022-09-14 MED ORDER — CELECOXIB 200 MG PO CAPS: 200.0000 mg | ORAL_CAPSULE | Freq: Two times a day (BID) | ORAL | 0 refills | Status: DC

## 2022-09-14 MED ORDER — MORPHINE SULFATE (PF) 4 MG/ML IV SOLN
4.0000 mg | Freq: Once | INTRAVENOUS | Status: AC
  Administered 2022-09-14: 4 mg via INTRAVENOUS
  Filled 2022-09-14: qty 1

## 2022-09-14 MED ORDER — TRANEXAMIC ACID 1000 MG/10ML IV SOLN
500.0000 mg | Freq: Once | INTRAVENOUS | Status: AC
  Administered 2022-09-14: 500 mg via INTRAVENOUS
  Filled 2022-09-14: qty 10

## 2022-09-14 MED ORDER — CELECOXIB 200 MG PO CAPS
200.0000 mg | ORAL_CAPSULE | Freq: Once | ORAL | Status: AC
  Administered 2022-09-14: 200 mg via ORAL
  Filled 2022-09-14: qty 1

## 2022-09-14 MED ORDER — ONDANSETRON HCL 4 MG/2ML IJ SOLN
4.0000 mg | Freq: Once | INTRAMUSCULAR | Status: AC
  Administered 2022-09-14: 4 mg via INTRAVENOUS
  Filled 2022-09-14: qty 2

## 2022-09-14 NOTE — ED Triage Notes (Signed)
Pt arrives to ED with c/o lower abdominal pain and vaginal bleeding that started today. Has been in talks with GYN for ablation.

## 2022-09-14 NOTE — ED Provider Notes (Signed)
[facility] [facility] EM[facility]PARTMENT AT [facility] Provider Note   CSN: [id number] Arrival date & time: 09/14/22  1355     History  Chief Complaint  Patient presents with   Vaginal Bleeding    Raven Gomez is a 52 y.o. female.  This is a 52 year old female here today for lower abdominal cramping and passing vaginal clots.  She says that she has a history of irregular menses, follows with her OB/GYN.  2 weeks ago, she had a biopsy done of her endometrium.  She is currently being evaluated for an endometrial ablation.  She says that she was at work today, and began to experience severe cramping, says that she passed a large clot.  She continues to have discomfort.   Vaginal Bleeding      Home Medications Prior to Admission medications   Medication Sig Start Date End Date Taking? Authorizing Provider  amphetamine-dextroamphetamine (ADDERALL) 20 MG tablet Take 1 tablet (20 mg total) by mouth daily before breakfast. Take 1/2 tab in pm prn. 09/17/22 10/17/22  Allwardt, Alyssa M, PA-C  amphetamine-dextroamphetamine (ADDERALL) 20 MG tablet Take 1 tablet (20 mg total) by mouth daily before breakfast. Take 1/2 tab in pm prn. 07/22/22 08/21/22  Allwardt, Alyssa M, PA-C  amphetamine-dextroamphetamine (ADDERALL) 20 MG tablet Take 1 tablet (20 mg total) by mouth daily before breakfast. Take 1/2 tab in pm prn. 08/25/22 09/24/22  Allwardt, Crist Infante, PA-C  azelastine (ASTELIN) 0.1 % nasal spray Place 1 spray into both nostrils in the morning. Use in each nostril as directed 06/30/20   Orland Mustard, MD  Cholecalciferol (VITAMIN D) 50 MCG (2000 UT) CAPS Take 1,000 Units by mouth 2 (two) times daily.    [provider]  fluorouracil (EFUDEX) 5 % cream Apply topically 2 (two) times daily. 12/14/19   [provider]  gabapentin (NEURONTIN) 100 MG capsule Take 1 capsule (100 mg total) by mouth 3 (three) times daily. Patient taking differently: Take 100 mg by mouth 3 (three) times  daily. PRN 07/22/20   Janeece Agee, NP  magnesium gluconate (MAGONATE) 500 MG tablet Take 500 mg by mouth 2 (two) times daily.    [provider]  valACYclovir (VALTREX) 1000 MG tablet Take 1 tablet (1,000 mg total) by mouth 3 (three) times daily. Patient taking differently: Take 1,000 mg by mouth 3 (three) times daily. PRN 07/22/20   Janeece Agee, NP      Allergies    Patient has no known allergies.    Review of Systems   Review of Systems  Genitourinary:  Positive for vaginal bleeding.    Physical Exam Updated Vital Signs BP (!) 148/88 (BP Location: Right Arm)   Pulse 98   Temp 97.9 F (36.6 C) (Oral)   Resp 18   Ht 5\' 1"  (1.549 m)   Wt 51.7 kg   SpO2 100%   BMI 21.54 kg/m  Physical Exam Vitals reviewed. Exam conducted with a chaperone present.  Constitutional:      Comments: Uncomfortable.  Abdominal:     Palpations: Abdomen is soft.     Tenderness: There is no abdominal tenderness.  Genitourinary:    General: Normal vulva.     Comments: Normal vaginal wall.  Cervix visualized, there was a small clot present at the cervical os.  Was able to removed with swab.  No active bleeding or continued discharge. Neurological:     Mental Status: She is alert.     ED Results / Procedures / Treatments  Labs (all labs ordered are listed, but only abnormal results are displayed) Labs Reviewed  CBC WITH DIFFERENTIAL/PLATELET  BASIC METABOLIC PANEL  CBC WITH DIFFERENTIAL/PLATELET  HCG, SERUM, QUALITATIVE    EKG None  Radiology No results found.  Procedures Procedures    Medications Ordered in ED Medications  ketorolac (TORADOL) 30 MG/ML injection 30 mg (30 mg Intravenous Given 09/14/22 1425)  morphine (PF) 4 MG/ML injection 4 mg (4 mg Intravenous Given 09/14/22 1427)  ondansetron (ZOFRAN) injection 4 mg (4 mg Intravenous Given 09/14/22 1425)  tranexamic acid (CYKLOKAPRON) injection 500 mg (500 mg Intravenous Given 09/14/22 1426)    ED Course/ Medical  Decision Making/ A&P Clinical Course as of 09/14/22 1528  Tue Sep 14, 2022  1522 Stable HO JTS 52 YOF with a ccx of VB and follows with GYN. Started having pain and cramping. Pex without brisk bleeding. Being treated for pain RO for endometritis.  Dispo per [place] results. [CC]    Clinical Course User Index [CC] Glyn Ade, MD                                 Medical Decision Making This is a 52 year old female here today with pelvic pain after passing clots.  Differential diagnoses include abnormal uterine bleeding, less likely uterine hemorrhage, less likely Tritus.  Plan # Endo pelvic exam performed, overall normal.  Definitely see clots, likely abnormal uterine bleeding, passing large volume of clots causing discomfort.  Will obtain transvaginal ultrasound on the patient.  hCG ordered.  Will check H&H.  Patient will be signed out to oncoming physician pending transvaginal ultrasound.  Amount and/or Complexity of Data Reviewed Labs: ordered. Radiology: ordered.  Risk Prescription drug management.          Final Clinical Impression(s) / ED Diagnoses Final diagnoses:  None    Rx / DC Orders ED Discharge Orders     None         Arletha Pili, DO 09/14/22 1528

## 2022-09-14 NOTE — ED Provider Notes (Signed)
Care of patient received from prior provider at 3:23 PM, please see their note for complete H/P and care plan.  Received handoff per ED course.  Clinical Course as of 09/14/22 1523  Tue Sep 14, 2022  1522 Stable HO JTS 83 YOF with a ccx of VB and follows with GYN. Started having pain and cramping. Pex without brisk bleeding. Being treated for pain RO for endometritis.  Dispo per [place] results. [CC]    Clinical Course User Index [CC] Glyn Ade, MD     Reassessed at bedside.  Vaginal bleeding has been ceased most of the day.  She is denying fevers chills nausea vomiting shortness of breath or any other symptoms.  The pressure is grossly improved.  She has already had Megace called in by her gynecologist and was informed to go pick that up as soon as she was discharged.  Given improvement of symptoms, negative findings on ultrasound she is stable for outpatient care and management.   Glyn Ade, MD 09/14/22 2317

## 2022-09-22 ENCOUNTER — Other Ambulatory Visit: Payer: Self-pay | Admitting: Obstetrics & Gynecology

## 2022-09-23 ENCOUNTER — Encounter (HOSPITAL_BASED_OUTPATIENT_CLINIC_OR_DEPARTMENT_OTHER): Payer: Self-pay | Admitting: Obstetrics & Gynecology

## 2022-09-23 ENCOUNTER — Other Ambulatory Visit: Payer: Self-pay

## 2022-09-23 NOTE — Progress Notes (Addendum)
Spoke w/ via phone for pre-op interview---pt Lab needs dos----     t & s, urine preg           Lab results------cbc with dif, bmp 09-14-2022 epic (ok per dr mody to use cbc with dif and bmp done 09-14-2022) COVID test -----patient states asymptomatic no test needed Arrive at -------1000 am 09-30-2022 NPO after MN NO Solid Food.  Clear liquids from MN until---900 Med rec completed Medications to take morning of surgery -----megesterol Diabetic medication -----n/a Patient instructed no nail polish to be worn day of surgery Patient instructed to bring photo id and insurance card day of surgery Patient aware to have Driver (ride ) / caregiver  sister in law stephanie Ursin   for 24 hours after surgery  Patient Special Instructions -----none Pre-Op special Instructions -----none Patient verbalized understanding of instructions that were given at this phone interview. Patient denies shortness of breath, chest pain, fever, cough at this phone interview.

## 2022-09-28 ENCOUNTER — Other Ambulatory Visit: Payer: Self-pay | Admitting: Physician Assistant

## 2022-09-28 NOTE — Telephone Encounter (Signed)
Pt requesting refill for Adderall 20 mg. Last OV 07/19/2022.

## 2022-09-29 NOTE — Anesthesia Preprocedure Evaluation (Signed)
Anesthesia Evaluation  Patient identified by MRN, date of birth, ID band Patient awake    Reviewed: Allergy & Precautions, NPO status , Patient's Chart, lab work & pertinent test results  Airway Mallampati: II  TM Distance: >3 FB Neck ROM: Full    Dental no notable dental hx. (+) Teeth Intact, Dental Advisory Given   Pulmonary neg pulmonary ROS   Pulmonary exam normal breath sounds clear to auscultation       Cardiovascular negative cardio ROS Normal cardiovascular exam Rhythm:Regular Rate:Normal     Neuro/Psych   Anxiety     negative neurological ROS  negative psych ROS   GI/Hepatic negative GI ROS, Neg liver ROS,,,  Endo/Other    Renal/GU      Musculoskeletal  (+) Arthritis ,    Abdominal   Peds  Hematology   Anesthesia Other Findings   Reproductive/Obstetrics                             Anesthesia Physical Anesthesia Plan  ASA: 2  Anesthesia Plan: General   Post-op Pain Management: Tylenol PO (pre-op)*, Toradol IV (intra-op)* and Precedex   Induction:   PONV Risk Score and Plan: Treatment may vary due to age or medical condition, Midazolam and Ondansetron  Airway Management Planned: LMA  Additional Equipment: None  Intra-op Plan:   Post-operative Plan:   Informed Consent: I have reviewed the patients History and Physical, chart, labs and discussed the procedure including the risks, benefits and alternatives for the proposed anesthesia with the patient or authorized representative who has indicated his/her understanding and acceptance.     Dental advisory given  Plan Discussed with:   Anesthesia Plan Comments:        Anesthesia Quick Evaluation

## 2022-09-30 ENCOUNTER — Ambulatory Visit (HOSPITAL_BASED_OUTPATIENT_CLINIC_OR_DEPARTMENT_OTHER): Payer: Commercial Managed Care - HMO | Admitting: Anesthesiology

## 2022-09-30 ENCOUNTER — Other Ambulatory Visit: Payer: Self-pay

## 2022-09-30 ENCOUNTER — Encounter (HOSPITAL_BASED_OUTPATIENT_CLINIC_OR_DEPARTMENT_OTHER)
Admission: RE | Disposition: A | Discharge status: Home / Self Care | Payer: Self-pay | Source: Home / Self Care | Attending: Obstetrics & Gynecology

## 2022-09-30 ENCOUNTER — Ambulatory Visit (HOSPITAL_BASED_OUTPATIENT_CLINIC_OR_DEPARTMENT_OTHER)
Admission: RE | Admit: 2022-09-30 | Discharge: 2022-09-30 | Disposition: A | Discharge status: Home / Self Care | Payer: Commercial Managed Care - HMO | Attending: Obstetrics & Gynecology | Admitting: Obstetrics & Gynecology

## 2022-09-30 ENCOUNTER — Encounter (HOSPITAL_BASED_OUTPATIENT_CLINIC_OR_DEPARTMENT_OTHER): Payer: Self-pay | Admitting: Obstetrics & Gynecology

## 2022-09-30 DIAGNOSIS — N921 Excessive and frequent menstruation with irregular cycle: Secondary | ICD-10-CM | POA: Diagnosis present

## 2022-09-30 DIAGNOSIS — Z01818 Encounter for other preprocedural examination: Secondary | ICD-10-CM

## 2022-09-30 HISTORY — DX: Excessive and frequent menstruation with regular cycle: N92.0

## 2022-09-30 HISTORY — DX: Unspecified osteoarthritis, unspecified site: M19.90

## 2022-09-30 HISTORY — PX: DILITATION & CURRETTAGE/HYSTROSCOPY WITH NOVASURE ABLATION: SHX5568

## 2022-09-30 HISTORY — DX: Presence of spectacles and contact lenses: Z97.3

## 2022-09-30 HISTORY — DX: Excessive and frequent menstruation with irregular cycle: N92.1

## 2022-09-30 LAB — TYPE AND SCREEN
ABO/RH(D): A POS
Antibody Screen: NEGATIVE

## 2022-09-30 LAB — POCT PREGNANCY, URINE: Preg Test, Ur: NEGATIVE

## 2022-09-30 SURGERY — DILATATION & CURETTAGE/HYSTEROSCOPY WITH NOVASURE ABLATION
Anesthesia: General | Site: Uterus

## 2022-09-30 MED ORDER — OXYCODONE HCL 5 MG/5ML PO SOLN: 5.0000 mg | Freq: Once | ORAL | Status: DC | PRN

## 2022-09-30 MED ORDER — HYDROMORPHONE HCL 1 MG/ML IJ SOLN: 0.2500 mg | INTRAMUSCULAR | Status: DC | PRN

## 2022-09-30 MED ORDER — ONDANSETRON HCL 4 MG/2ML IJ SOLN: 4.0000 mg | Freq: Once | INTRAMUSCULAR | Status: DC | PRN

## 2022-09-30 MED ORDER — DEXAMETHASONE SODIUM PHOSPHATE 4 MG/ML IJ SOLN
INTRAMUSCULAR | Status: DC | PRN
  Administered 2022-09-30: 8 mg via INTRAVENOUS

## 2022-09-30 MED ORDER — MEGESTROL ACETATE 40 MG PO TABS: 40.0000 mg | ORAL_TABLET | Freq: Two times a day (BID) | ORAL | 0 refills | Status: AC

## 2022-09-30 MED ORDER — POVIDONE-IODINE 10 % EX SWAB: 2.0000 | Freq: Once | CUTANEOUS | Status: DC

## 2022-09-30 MED ORDER — PHENYLEPHRINE HCL (PRESSORS) 10 MG/ML IV SOLN
INTRAVENOUS | Status: DC | PRN
  Administered 2022-09-30 (×2): 80 ug via INTRAVENOUS

## 2022-09-30 MED ORDER — PROPOFOL 1000 MG/100ML IV EMUL
INTRAVENOUS | Status: AC
  Filled 2022-09-30: qty 100

## 2022-09-30 MED ORDER — IBUPROFEN 200 MG PO TABS: 600.0000 mg | ORAL_TABLET | Freq: Four times a day (QID) | ORAL | 0 refills | Status: AC | PRN

## 2022-09-30 MED ORDER — MIDAZOLAM HCL 2 MG/2ML IJ SOLN
INTRAMUSCULAR | Status: AC
  Filled 2022-09-30: qty 2

## 2022-09-30 MED ORDER — LIDOCAINE HCL 1 % IJ SOLN
INTRAMUSCULAR | Status: DC | PRN
  Administered 2022-09-30: 20 mL

## 2022-09-30 MED ORDER — LIDOCAINE HCL (PF) 2 % IJ SOLN
INTRAMUSCULAR | Status: AC
  Filled 2022-09-30: qty 10

## 2022-09-30 MED ORDER — ACETAMINOPHEN 10 MG/ML IV SOLN
INTRAVENOUS | Status: AC
  Filled 2022-09-30: qty 100

## 2022-09-30 MED ORDER — ONDANSETRON HCL 4 MG/2ML IJ SOLN
INTRAMUSCULAR | Status: AC
  Filled 2022-09-30: qty 4

## 2022-09-30 MED ORDER — KETOROLAC TROMETHAMINE 30 MG/ML IJ SOLN
INTRAMUSCULAR | Status: DC | PRN
  Administered 2022-09-30: 15 mg via INTRAVENOUS

## 2022-09-30 MED ORDER — MIDAZOLAM HCL 2 MG/2ML IJ SOLN
INTRAMUSCULAR | Status: DC | PRN
  Administered 2022-09-30: 2 mg via INTRAVENOUS

## 2022-09-30 MED ORDER — AMPHETAMINE-DEXTROAMPHETAMINE 20 MG PO TABS: 20.0000 mg | ORAL_TABLET | Freq: Every day | ORAL | 0 refills | Status: DC

## 2022-09-30 MED ORDER — KETOROLAC TROMETHAMINE 30 MG/ML IJ SOLN: 30.0000 mg | Freq: Once | INTRAMUSCULAR | Status: DC | PRN

## 2022-09-30 MED ORDER — DEXAMETHASONE SODIUM PHOSPHATE 10 MG/ML IJ SOLN
INTRAMUSCULAR | Status: AC
  Filled 2022-09-30: qty 2

## 2022-09-30 MED ORDER — EPHEDRINE 5 MG/ML INJ
INTRAVENOUS | Status: AC
  Filled 2022-09-30: qty 5

## 2022-09-30 MED ORDER — PROPOFOL 10 MG/ML IV BOLUS
INTRAVENOUS | Status: DC | PRN
  Administered 2022-09-30: 80 mg via INTRAVENOUS

## 2022-09-30 MED ORDER — PHENYLEPHRINE 80 MCG/ML (10ML) SYRINGE FOR IV PUSH (FOR BLOOD PRESSURE SUPPORT)
PREFILLED_SYRINGE | INTRAVENOUS | Status: AC
  Filled 2022-09-30: qty 10

## 2022-09-30 MED ORDER — FENTANYL CITRATE (PF) 100 MCG/2ML IJ SOLN
INTRAMUSCULAR | Status: AC
  Filled 2022-09-30: qty 2

## 2022-09-30 MED ORDER — LACTATED RINGERS IV SOLN: INTRAVENOUS | Status: DC

## 2022-09-30 MED ORDER — OXYCODONE HCL 5 MG PO TABS
5.0000 mg | ORAL_TABLET | ORAL | 0 refills | Status: AC | PRN
Start: 2022-09-30 — End: 2022-10-03

## 2022-09-30 MED ORDER — OXYCODONE HCL 5 MG PO TABS: 5.0000 mg | ORAL_TABLET | Freq: Once | ORAL | Status: DC | PRN

## 2022-09-30 MED ORDER — CEFAZOLIN SODIUM-DEXTROSE 2-4 GM/100ML-% IV SOLN
INTRAVENOUS | Status: AC
  Filled 2022-09-30: qty 100

## 2022-09-30 MED ORDER — KETOROLAC TROMETHAMINE 30 MG/ML IJ SOLN
INTRAMUSCULAR | Status: AC
  Filled 2022-09-30: qty 1

## 2022-09-30 MED ORDER — FENTANYL CITRATE (PF) 100 MCG/2ML IJ SOLN
INTRAMUSCULAR | Status: DC | PRN
  Administered 2022-09-30: 50 ug via INTRAVENOUS

## 2022-09-30 MED ORDER — DEXMEDETOMIDINE HCL IN NACL 80 MCG/20ML IV SOLN
INTRAVENOUS | Status: DC | PRN
  Administered 2022-09-30: 4 ug via INTRAVENOUS

## 2022-09-30 MED ORDER — CEFAZOLIN SODIUM-DEXTROSE 2-4 GM/100ML-% IV SOLN
2.0000 g | INTRAVENOUS | Status: AC
  Administered 2022-09-30: 2 g via INTRAVENOUS

## 2022-09-30 MED ORDER — ONDANSETRON HCL 4 MG/2ML IJ SOLN
INTRAMUSCULAR | Status: DC | PRN
  Administered 2022-09-30: 4 mg via INTRAVENOUS

## 2022-09-30 MED ORDER — LIDOCAINE HCL (CARDIAC) PF 100 MG/5ML IV SOSY
PREFILLED_SYRINGE | INTRAVENOUS | Status: DC | PRN
  Administered 2022-09-30: 50 mg via INTRAVENOUS

## 2022-09-30 MED ORDER — ACETAMINOPHEN 10 MG/ML IV SOLN
INTRAVENOUS | Status: DC | PRN
  Administered 2022-09-30: 1000 mg via INTRAVENOUS

## 2022-09-30 SURGICAL SUPPLY — 14 items
ABLATOR SURESOUND NOVASURE (ABLATOR) ×1 IMPLANT
DRSG TELFA 3X8 NADH STRL (GAUZE/BANDAGES/DRESSINGS) ×1 IMPLANT
GAUZE 4X4 16PLY ~~LOC~~+RFID DBL (SPONGE) ×1 IMPLANT
GLOVE BIO SURGEON STRL SZ7 (GLOVE) ×1 IMPLANT
GLOVE BIOGEL PI IND STRL 7.0 (GLOVE) ×2 IMPLANT
GOWN STRL REUS W/TWL LRG LVL3 (GOWN DISPOSABLE) ×2 IMPLANT
KIT PROCEDURE FLUENT (KITS) ×1 IMPLANT
KIT TURNOVER CYSTO (KITS) ×1 IMPLANT
LOOP CUTTING BIPOLAR 21FR (ELECTRODE) IMPLANT
PACK VAGINAL MINOR WOMEN LF (CUSTOM PROCEDURE TRAY) ×1 IMPLANT
PAD OB MATERNITY 4.3X12.25 (PERSONAL CARE ITEMS) ×1 IMPLANT
SEAL ROD LENS SCOPE MYOSURE (ABLATOR) ×1 IMPLANT
SLEEVE SCD COMPRESS KNEE MED (STOCKING) ×1 IMPLANT
TOWEL OR 17X24 6PK STRL BLUE (TOWEL DISPOSABLE) ×2 IMPLANT

## 2022-09-30 NOTE — H&P (Signed)
Raven Gomez  52 yo with Dysfunctional uterine bleeding, irregular and heavy menses with failed medical therapy. She presents for endometrial ablation.  She says menses skip at times but then gets frequent and heavy. Passes clots.  Pelvic Ultrasound January 2023 was normal, no fibroids/ cysts.  Endometrial biopsy secretory in Jul'24- No hyperplasia. Reviewed options, progestin cycling, progestin IUD, or endometrial ablation.  Pap nl 2023, HPV neg. FSH in normal, not in menopause. She is currently single   Patient's last menstrual period was 09/14/2022.    Past Medical History:  Diagnosis Date   ADHD    Arthritis    Menorrhagia    Wears glasses     Past Surgical History:  Procedure Laterality Date   CESAREAN SECTION  2003   CHOLECYSTECTOMY N/A 08/28/2017   Procedure: LAPAROSCOPIC CHOLECYSTECTOMY WITH INTRAOPERATIVE CHOLANGIOGRAM;  Surgeon: Ovidio Kin, MD;  Location: [facility];  Service: General;  Laterality: N/A;   ORIF TOE FRACTURE Right 12/11/2018   Procedure: RIGHT 4TH TOE PINNING;  Surgeon: Terance Hart, MD;  Location: [facility];  Service: Orthopedics;  Laterality: Right;  CPT CODE 91478 SURGERY REQUEST TIME 30 MINUTES   WISDOM TOOTH EXTRACTION  2008    Family History  Problem Relation Age of Onset   Breast cancer Mother 72       12/2014   Arthritis Mother    Cancer Mother    Depression Mother    Hypertension Mother    Stroke Mother    Cancer Father    COPD Father    Heart attack Father    Hypertension Father    Depression Brother    Mental illness Brother    Cancer Maternal Grandmother    Depression Maternal Grandmother    Diabetes Maternal Grandmother    Mental illness Maternal Grandmother    Stroke Maternal Grandfather    Heart disease Paternal Grandfather    Hypertension Paternal Grandfather    Colon cancer Neg Hx    Esophageal cancer Neg Hx    Stomach cancer Neg Hx    Rectal cancer Neg Hx     Social History:  reports that  she has never smoked. She has never used smokeless tobacco. She reports current alcohol use. She reports that she does not use drugs.  Allergies: No Known Allergies  Facility-Administered Medications Prior to Admission  Medication Dose Route Frequency Provider Last Rate Last Admin   0.9 %  sodium chloride infusion  500 mL Intravenous Once Sherrilyn Rist, MD       Medications Prior to Admission  Medication Sig Dispense Refill Last Dose   amphetamine-dextroamphetamine (ADDERALL) 20 MG tablet Take 1 tablet (20 mg total) by mouth daily before breakfast. Take 1/2 tab in pm prn. 45 tablet 0 09/29/2022   celecoxib (CELEBREX) 200 MG capsule Take 1 capsule (200 mg total) by mouth 2 (two) times daily. 20 capsule 0 Past Month   Cholecalciferol (VITAMIN D) 50 MCG (2000 UT) CAPS Take 1,000 Units by mouth 2 (two) times daily.   Past Week   megestrol (MEGACE) 40 MG tablet Take 40 mg by mouth 2 (two) times daily. Changes to 40 mg q day on 09-24-2022   09/29/2022   meloxicam (MOBIC) 15 MG tablet Take 15 mg by mouth daily.   09/29/2022   magnesium gluconate (MAGONATE) 500 MG tablet Take 500 mg by mouth 2 (two) times daily.   09/28/2022   valACYclovir (VALTREX) 1000 MG tablet Take 1 tablet (1,000 mg total) by  mouth 3 (three) times daily. (Patient taking differently: Take 1,000 mg by mouth 3 (three) times daily. PRN) 21 tablet 0 Unknown     Review of Systems  no CP SOB dizziness but has fatigue from bleeding   Physical Exam Ht 4\' 11"  (1.499 m)   Wt 51.7 kg   LMP 09/14/2022   BMI 23.03 kg/m   A&O x 3, no acute distress. Pleasant HEENT neg, no thyromegaly Lungs CTA bilat CV RRR, S1S2 normal Abdo soft, non tender, non acute Extr no edema/ tenderness Pelvic Uterus 6 wks, mobile, nl cx. No adnexal mass  Results for orders placed or performed during the hospital encounter of 09/30/22 (from the past 24 hour(s))  Pregnancy, urine POC     Status: None   Collection Time: 09/30/22 10:13 AM  Result Value Ref  Range   Preg Test, Ur NEGATIVE NEGATIVE    No results found.  Assessment/Plan: 52 yo female with ongoing heavy irregular menses. Failed medical management. She wants to proceed with endometrial ablation. Declines hysterectomy  Consents for hysteroscopy and Novasure endometrial ablation.  Risks/complications of surgery reviewed incl infection, bleeding, damage to internal organs including bladder, bowels, ureters, blood vessels, other risks from anesthesia, VTE and delayed complications of any surgery, complications in future surgery reviewed.  Robley Fries 09/30/2022, 10:18 AM

## 2022-09-30 NOTE — Transfer of Care (Signed)
Immediate Anesthesia Transfer of Care Note  Patient: Raven Gomez  Procedure(s) Performed: DILATATION & CURETTAGE/HYSTEROSCOPY WITH NOVASURE ABLATION (Uterus)  Patient Location: PACU  Anesthesia Type:General  Level of Consciousness: drowsy  Airway & Oxygen Therapy: Patient Spontanous Breathing and Patient connected to nasal cannula oxygen  Post-op Assessment: Report given to RN and Post -op Vital signs reviewed and stable  Post vital signs: Reviewed and stable  Last Vitals:  Vitals Value Taken Time  BP 133/80 09/30/22 1215  Temp    Pulse 86 09/30/22 1215  Resp 14 09/30/22 1215  SpO2 100 % 09/30/22 1215  Vitals shown include unfiled device data.  Last Pain: There were no vitals filed for this visit.       Complications: No notable events documented.

## 2022-09-30 NOTE — Op Note (Signed)
Raven Gomez  19-Jan-1971  Procedure  Diagnostic hysteroscopy and Novasure endometrial ablation and endometrial curettage   Preoperative diagnosis: Menometrorrhagia Postop diagnosis: Same  Procedure: Hysteroscopy, endometrial curettage and Novasure endometrial ablation  Anesthesia General via LMA and 20 cc Lidocaine paracervical block  Surgeon: Shea Evans, MD  Assistant: None IV fluids 400 cc LR Estimated blood loss 10 cc Urine output: NA, voided before surgery  Complications none  Condition stable  Disposition PACU  Specimen: endometrial curettings (minimal)    Procedure  Indication: Menometrorhagia. Office sono noted normal uterus. Heavy bleeding with clots, frequent periods.  Failed medical management. Novasure recommended and patient was counseled on risks/ complications including infection, bleeding, damage to internal organs, she understood and agrees, gave informed written consent.  Patient was brought to the operating room with IV running. Time out was carried out. She received preop 2 gm Ancef. She underwent general anesthesia via LMA without complications. She was given dorsolithotomy position. Parts were prepped and draped in standard fashion. Bimanual exam revealed uterus to be anteverted and normal size. Speculum was placed and cervix was grasped with single-tooth tenaculum. Cervical block with 20 cc 1% plain Xylocaine given. The uterus was sounded to 8 cm. Cervical os was dilated to 19 [place] dilator. Hysteroscope was introduced in the uterine cavity under vision, using saline for irrigation. Findings: posterior uterine cavity with slight endometrial growth , rest was very thin and both tubal ostii seen well, cervical canal was long and normal. Uterine cavity measured at 4 cm and width at 4.2 cm. Measurements entered for Novasure.  Endometrial curettage was performed with sharp curette and sent to path.  Novasure ablation performed, total time of burn 1.48 min. Power 84 W   Post ablation hysteroscopy noted excellent endometrial burn and no burn in cervical canal.   Fluid deficit 95 cc.   All counts are correct x2. No complications. Patient brought to the recovery room in stable condition. Warning signs of infection and excessive bleeding reviewed.  I performed this procedure.  Shea Evans, MD.

## 2022-09-30 NOTE — Anesthesia Postprocedure Evaluation (Signed)
Anesthesia Post Note  Patient: Maribell Stenerson Bellanca  Procedure(s) Performed: DILATATION & CURETTAGE/HYSTEROSCOPY WITH NOVASURE ABLATION (Uterus)     Patient location during evaluation: PACU Anesthesia Type: General Level of consciousness: awake and alert Pain management: pain level controlled Vital Signs Assessment: post-procedure vital signs reviewed and stable Respiratory status: spontaneous breathing, nonlabored ventilation, respiratory function stable and patient connected to nasal cannula oxygen Cardiovascular status: blood pressure returned to baseline and stable Postop Assessment: no apparent nausea or vomiting Anesthetic complications: no   No notable events documented.  Last Vitals:  Vitals:   09/30/22 1245 09/30/22 1320  BP: (!) 147/88 (!) 143/84  Pulse: 79 84  Resp: 19 16  Temp:  36.8 C  SpO2: 100% 100%    Last Pain:  Vitals:   09/30/22 1320  PainSc: 0-No pain                 Trevor Iha

## 2022-09-30 NOTE — Anesthesia Procedure Notes (Signed)
Procedure Name: LMA Insertion Date/Time: 09/30/2022 11:32 AM  Performed by: Earmon Phoenix, CRNAPre-anesthesia Checklist: Patient identified, Emergency Drugs available, Suction available, Patient being monitored and Timeout performed Patient Re-evaluated:Patient Re-evaluated prior to induction Oxygen Delivery Method: Circle system utilized Preoxygenation: Pre-oxygenation with 100% oxygen Induction Type: IV induction Ventilation: Mask ventilation without difficulty LMA: LMA inserted LMA Size: 3.0 Number of attempts: 1 Placement Confirmation: positive ETCO2 and breath sounds checked- equal and bilateral Tube secured with: Tape Dental Injury: Teeth and Oropharynx as per pre-operative assessment

## 2022-09-30 NOTE — Discharge Instructions (Signed)
No acetaminophen/Tylenol until after 6:00 pm today if needed.  No ibuprofen, Advil, Aleve, Motrin, ketorolac, meloxicam, naproxen, or other NSAIDS until after 6:00 pm today if needed.   Post Anesthesia Home Care Instructions  Activity: Get plenty of rest for the remainder of the day. A responsible individual must stay with you for 24 hours following the procedure.  For the next 24 hours, DO NOT: -Drive a car -Advertising copywriter -Drink alcoholic beverages -Take any medication unless instructed by your physician -Make any legal decisions or sign important papers.  Meals: Start with liquid foods such as gelatin or soup. Progress to regular foods as tolerated. Avoid greasy, spicy, heavy foods. If nausea and/or vomiting occur, drink only clear liquids until the nausea and/or vomiting subsides. Call your physician if vomiting continues.  Special Instructions/Symptoms: Your throat may feel dry or sore from the anesthesia or the breathing tube placed in your throat during surgery. If this causes discomfort, gargle with warm salt water. The discomfort should disappear within 24 hours.

## 2022-10-01 ENCOUNTER — Encounter (HOSPITAL_BASED_OUTPATIENT_CLINIC_OR_DEPARTMENT_OTHER): Payer: Self-pay | Admitting: Obstetrics & Gynecology

## 2022-10-01 LAB — SURGICAL PATHOLOGY

## 2022-11-01 ENCOUNTER — Ambulatory Visit: Payer: Commercial Managed Care - HMO | Admitting: Physician Assistant

## 2022-11-02 ENCOUNTER — Telehealth: Payer: Self-pay | Admitting: Physician Assistant

## 2022-11-02 NOTE — Telephone Encounter (Signed)
Patient was scheduled for 9/23 @ 1:30 pm with PCP for med refill. Patient called yesterday @ about 1:40 pm and explained she was unable to check in due to receiving an alarming urgent call prior to walking in. Patient was clearly distressed and apologetic. I offered to reschedule patient at the time of her phone call but she thought it best to reschedule when she gets her bearings.   Patient has been rescheduled to 10/8 but only has four pills left of adderall. Can she have enough sent to her to last until 10/8 OV?

## 2022-11-02 NOTE — Telephone Encounter (Signed)
Returned pt call and was advised she is ok, dealing with passing of her Mom and estate issues. Pt thankful for refill and will be in the office on 11/16/22

## 2022-11-02 NOTE — Telephone Encounter (Signed)
Please see pt call message and advise

## 2022-11-03 ENCOUNTER — Other Ambulatory Visit: Payer: Self-pay | Admitting: Physician Assistant

## 2022-11-03 MED ORDER — AMPHETAMINE-DEXTROAMPHETAMINE 20 MG PO TABS: 20.0000 mg | ORAL_TABLET | Freq: Every day | ORAL | 0 refills | Status: DC

## 2022-11-03 NOTE — Telephone Encounter (Signed)
Noted and agreed, thank you. Refill sent.

## 2022-11-16 ENCOUNTER — Encounter: Payer: Self-pay | Admitting: Physician Assistant

## 2022-11-16 ENCOUNTER — Ambulatory Visit (INDEPENDENT_AMBULATORY_CARE_PROVIDER_SITE_OTHER): Payer: Managed Care, Other (non HMO) | Admitting: Physician Assistant

## 2022-11-16 VITALS — BP 128/84 | HR 89 | Temp 98.2°F | Ht 59.0 in | Wt 120.0 lb

## 2022-11-16 DIAGNOSIS — F988 Other specified behavioral and emotional disorders with onset usually occurring in childhood and adolescence: Secondary | ICD-10-CM

## 2022-11-16 NOTE — Progress Notes (Signed)
Subjective:    Patient ID: Raven Gomez, female    DOB: [birth year], 52 y.o.   MRN: [id number]  Chief Complaint  Patient presents with   ADHD    Pt in office for 3 mon f/u and med check;     HPI Patient is in today for 3 mo regular f/up ADHD.  Interim hx: Ablation (D&C) 09/30/22 with Dr. Juliene Pina - no issues since then   Past Medical History:  Diagnosis Date   ADHD    Arthritis    Menometrorrhagia 09/30/2022   Menorrhagia    Wears glasses     Past Surgical History:  Procedure Laterality Date   CESAREAN SECTION  2003   CHOLECYSTECTOMY N/A 08/28/2017   Procedure: LAPAROSCOPIC CHOLECYSTECTOMY WITH INTRAOPERATIVE CHOLANGIOGRAM;  Surgeon: Ovidio Kin, MD;  Location: [facility];  Service: General;  Laterality: N/A;   DILITATION & CURRETTAGE/HYSTROSCOPY WITH NOVASURE ABLATION N/A 09/30/2022   Procedure: DILATATION & CURETTAGE/HYSTEROSCOPY WITH NOVASURE ABLATION;  Surgeon: Shea Evans, MD;  Location: [facility];  Service: Gynecology;  Laterality: N/A;   ORIF TOE FRACTURE Right 12/11/2018   Procedure: RIGHT 4TH TOE PINNING;  Surgeon: Terance Hart, MD;  Location: [facility];  Service: Orthopedics;  Laterality: Right;  CPT CODE 21308 SURGERY REQUEST TIME 30 MINUTES   WISDOM TOOTH EXTRACTION  2008    Family History  Problem Relation Age of Onset   Breast cancer Mother 21       12/2014   Arthritis Mother    Cancer Mother    Depression Mother    Hypertension Mother    Stroke Mother    Cancer Father    COPD Father    Heart attack Father    Hypertension Father    Depression Brother    Mental illness Brother    Cancer Maternal Grandmother    Depression Maternal Grandmother    Diabetes Maternal Grandmother    Mental illness Maternal Grandmother    Stroke Maternal Grandfather    Heart disease Paternal Grandfather    Hypertension Paternal Grandfather    Colon cancer Neg Hx    Esophageal cancer Neg Hx    Stomach cancer Neg Hx    Rectal  cancer Neg Hx     Social History   Tobacco Use   Smoking status: Never   Smokeless tobacco: Never  Vaping Use   Vaping status: Never Used  Substance Use Topics   Alcohol use: Yes    Comment: 3-5 glasses of wine per week   Drug use: No     No Known Allergies  Review of Systems NEGATIVE UNLESS OTHERWISE INDICATED IN HPI      Objective:     BP 128/84 (BP Location: Left Arm)   Pulse 89   Temp 98.2 F (36.8 C) (Temporal)   Ht 4\' 11"  (1.499 m)   Wt 120 lb (54.4 kg)   SpO2 97%   BMI 24.24 kg/m   Wt Readings from Last 3 Encounters:  11/16/22 120 lb (54.4 kg)  09/30/22 120 lb 4.8 oz (54.6 kg)  09/14/22 114 lb (51.7 kg)    BP Readings from Last 3 Encounters:  11/16/22 128/84  09/30/22 (!) 143/84  09/14/22 113/89     Physical Exam Vitals and nursing note reviewed.  Constitutional:      Appearance: Normal appearance.  Cardiovascular:     Rate and Rhythm: Normal rate and regular rhythm.  Pulmonary:     Effort: Pulmonary effort is normal.  Breath sounds: Normal breath sounds.  Skin:    Findings: No lesion or rash.  Neurological:     General: No focal deficit present.     Mental Status: She is alert and oriented to person, place, and time.  Psychiatric:        Mood and Affect: Mood normal.        Behavior: Behavior normal.        Assessment & Plan:  Attention deficit disorder (ADD) without hyperactivity Assessment & Plan: Stable, chronic Adderall 20 mg 1 tab qAM and 1/2 tab afternoon PDMP reviewed today, no red flags, filling appropriately.  F/up in 3 months or prn.       Return in about 3 months (around 02/16/2023) for recheck/follow-up.    Yuritza Paulhus M Arlin Sass, PA-C

## 2022-11-16 NOTE — Assessment & Plan Note (Signed)
Stable, chronic Adderall 20 mg 1 tab qAM and 1/2 tab afternoon PDMP reviewed today, no red flags, filling appropriately.  F/up in 3 months or prn.

## 2022-12-03 ENCOUNTER — Other Ambulatory Visit: Payer: Self-pay | Admitting: Physician Assistant

## 2022-12-03 MED ORDER — AMPHETAMINE-DEXTROAMPHETAMINE 20 MG PO TABS: 20.0000 mg | ORAL_TABLET | Freq: Every day | ORAL | 0 refills | Status: DC

## 2022-12-03 NOTE — Telephone Encounter (Signed)
Last OV: 11/16/22  Next OV: 02/21/23  Last filled: 11/03/22  Quantity: 45

## 2022-12-29 ENCOUNTER — Other Ambulatory Visit: Payer: Self-pay | Admitting: Family Medicine

## 2022-12-29 DIAGNOSIS — B029 Zoster without complications: Secondary | ICD-10-CM

## 2023-01-03 ENCOUNTER — Other Ambulatory Visit: Payer: Self-pay | Admitting: Physician Assistant

## 2023-01-03 MED ORDER — AMPHETAMINE-DEXTROAMPHETAMINE 20 MG PO TABS: 20.0000 mg | ORAL_TABLET | Freq: Every day | ORAL | 0 refills | Status: DC

## 2023-01-03 NOTE — Telephone Encounter (Signed)
Last OV: 11/16/22  Next OV: 02/21/23  Last Filled: 12/03/22  Quantity: 45

## 2023-02-03 ENCOUNTER — Other Ambulatory Visit: Payer: Self-pay | Admitting: Physician Assistant

## 2023-02-03 MED ORDER — MELOXICAM 15 MG PO TABS: 15.0000 mg | ORAL_TABLET | Freq: Every day | ORAL | 0 refills | Status: DC

## 2023-02-03 MED ORDER — AMPHETAMINE-DEXTROAMPHETAMINE 20 MG PO TABS: 20.0000 mg | ORAL_TABLET | Freq: Every day | ORAL | 0 refills | Status: DC

## 2023-02-21 ENCOUNTER — Ambulatory Visit (INDEPENDENT_AMBULATORY_CARE_PROVIDER_SITE_OTHER): Payer: Commercial Managed Care - HMO | Admitting: Physician Assistant

## 2023-02-21 ENCOUNTER — Encounter: Payer: Self-pay | Admitting: Physician Assistant

## 2023-02-21 VITALS — BP 130/80 | HR 62 | Temp 98.8°F | Ht 59.0 in | Wt 114.0 lb

## 2023-02-21 DIAGNOSIS — F988 Other specified behavioral and emotional disorders with onset usually occurring in childhood and adolescence: Secondary | ICD-10-CM | POA: Diagnosis not present

## 2023-02-21 DIAGNOSIS — Z8619 Personal history of other infectious and parasitic diseases: Secondary | ICD-10-CM

## 2023-02-21 DIAGNOSIS — M19041 Primary osteoarthritis, right hand: Secondary | ICD-10-CM | POA: Diagnosis not present

## 2023-02-21 MED ORDER — VALACYCLOVIR HCL 1 G PO TABS
ORAL_TABLET | ORAL | 1 refills | Status: AC
Start: 2023-02-21 — End: ?

## 2023-02-21 NOTE — Progress Notes (Signed)
 Patient ID: Raven Gomez, female    DOB: [birth year], 53 y.o.   MRN: [id number]   Assessment & Plan:  Attention deficit disorder (ADD) without hyperactivity Assessment & Plan: Stable, chronic Adderall 20 mg 1 tab qAM and 1/2 tab afternoon PDMP reviewed today, no red flags, filling appropriately.  F/up in 3 months or prn.    Primary osteoarthritis of right hand  History of cold sores -     valACYclovir  HCl; Take 2 pills twice a day for 1 day at first sign of cold sore  Dispense: 15 tablet; Refill: 1       Arthritis Reports of hand pain and swelling, particularly in the morning. Works as social worker. Noted that alcohol exacerbates symptoms. Currently taking Meloxicam  daily, which provides relief but is concerned about long-term use due to potential kidney and liver effects. -Continue Meloxicam  as needed, with periodic breaks to assess symptom severity without medication. -Consider non-pharmacological interventions such as hand massages and heat application. -Monitor kidney and liver function in the next visit. 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 No current lesions. -Refill Valtrex  prescription for future outbreaks.  General Health Maintenance -Plan for fasting labs in three months to check cholesterol and kidney function.         Return in about 3 months (around 05/22/2023) for recheck/follow-up, fasting labs .    Subjective:    Chief Complaint  Patient presents with   ADD    HPI Discussed the use of AI scribe software for clinical note transcription with the patient, who gave verbal consent to proceed.  History of Present Illness   The patient, with a history of arthritis, recently returned from a vacation in [place] . She reports feeling generally well, but notes that her arthritis symptoms can flare up, particularly affecting her hands. She has  been managing these symptoms with meloxicam , which she takes daily at night. She notes a significant difference in her symptoms when she stops taking the medication. The patient also recently had braces removed and experienced a fever blister for which she took Valtrex . She has noticed weight fluctuations, but is making efforts to maintain a healthy lifestyle, including regular exercise. She also mentions trying to cut back on wine as she has noticed it can exacerbate her arthritis symptoms.       Past Medical History:  Diagnosis Date   ADHD    Arthritis    Menometrorrhagia 09/30/2022   Menorrhagia    Wears glasses     Past Surgical History:  Procedure Laterality Date   CESAREAN SECTION  2003   CHOLECYSTECTOMY N/A 08/28/2017   Procedure: LAPAROSCOPIC CHOLECYSTECTOMY WITH INTRAOPERATIVE CHOLANGIOGRAM;  Surgeon: Ethyl Lenis, MD;  Location: [facility];  Service: General;  Laterality: N/A;   DILITATION & CURRETTAGE/HYSTROSCOPY WITH NOVASURE ABLATION N/A 09/30/2022   Procedure: DILATATION & CURETTAGE/HYSTEROSCOPY WITH NOVASURE ABLATION;  Surgeon: Barbette Knock, MD;  Location: [facility];  Service: Gynecology;  Laterality: N/A;   ORIF TOE FRACTURE Right 12/11/2018   Procedure: RIGHT 4TH TOE PINNING;  Surgeon: Elsa Lonni SAUNDERS, MD;  Location: [facility];  Service: Orthopedics;  Laterality: Right;  CPT CODE 71333 SURGERY REQUEST TIME 30 MINUTES   WISDOM TOOTH EXTRACTION  2008    Family History  Problem Relation Age of Onset   Breast cancer Mother 65       12/2014   Arthritis  Mother    Cancer Mother    Depression Mother    Hypertension Mother    Stroke Mother    Cancer Father    COPD Father    Heart attack Father    Hypertension Father    Depression Brother    Mental illness Brother    Cancer Maternal Grandmother    Depression Maternal Grandmother    Diabetes Maternal Grandmother    Mental illness Maternal Grandmother    Stroke Maternal Grandfather     Heart disease Paternal Grandfather    Hypertension Paternal Grandfather    Colon cancer Neg Hx    Esophageal cancer Neg Hx    Stomach cancer Neg Hx    Rectal cancer Neg Hx     Social History   Tobacco Use   Smoking status: Never   Smokeless tobacco: Never  Vaping Use   Vaping status: Never Used  Substance Use Topics   Alcohol use: Yes    Comment: 3-5 glasses of wine per week   Drug use: No     No Known Allergies  Review of Systems NEGATIVE UNLESS OTHERWISE INDICATED IN HPI      Objective:     BP 130/80 (BP Location: Left Arm, Patient Position: Sitting, Cuff Size: Normal)   Pulse 62   Temp 98.8 F (37.1 C) (Temporal)   Ht 4' 11 (1.499 Gomez)   Wt 114 lb (51.7 kg)   SpO2 98%   BMI 23.03 kg/Gomez   Wt Readings from Last 3 Encounters:  02/21/23 114 lb (51.7 kg)  11/16/22 120 lb (54.4 kg)  09/30/22 120 lb 4.8 oz (54.6 kg)    BP Readings from Last 3 Encounters:  02/21/23 130/80  11/16/22 128/84  09/30/22 (!) 143/84     Physical Exam Vitals and nursing note reviewed.  Constitutional:      Appearance: Normal appearance.  Cardiovascular:     Rate and Rhythm: Normal rate and regular rhythm.  Pulmonary:     Effort: Pulmonary effort is normal.     Breath sounds: Normal breath sounds.  Skin:    Findings: No lesion or rash.  Neurological:     General: No focal deficit present.     Mental Status: She is alert and oriented to person, place, and time.  Psychiatric:        Mood and Affect: Mood normal.        Behavior: Behavior normal.         Raven Gomez Raven Alonge, PA-C

## 2023-02-21 NOTE — Assessment & Plan Note (Signed)
Stable, chronic Adderall 20 mg 1 tab qAM and 1/2 tab afternoon PDMP reviewed today, no red flags, filling appropriately.  F/up in 3 months or prn.

## 2023-03-08 ENCOUNTER — Other Ambulatory Visit: Payer: Self-pay | Admitting: Physician Assistant

## 2023-03-08 ENCOUNTER — Other Ambulatory Visit (HOSPITAL_BASED_OUTPATIENT_CLINIC_OR_DEPARTMENT_OTHER): Payer: Self-pay | Admitting: Optometrist

## 2023-03-08 ENCOUNTER — Ambulatory Visit (HOSPITAL_BASED_OUTPATIENT_CLINIC_OR_DEPARTMENT_OTHER)
Admission: RE | Admit: 2023-03-08 | Discharge: 2023-03-08 | Disposition: A | Discharge status: Home / Self Care | Payer: Commercial Managed Care - HMO | Source: Ambulatory Visit | Attending: Optometry | Admitting: Optometry

## 2023-03-08 DIAGNOSIS — W108XXA Fall (on) (from) other stairs and steps, initial encounter: Secondary | ICD-10-CM | POA: Diagnosis not present

## 2023-03-08 DIAGNOSIS — R519 Headache, unspecified: Secondary | ICD-10-CM | POA: Insufficient documentation

## 2023-03-08 DIAGNOSIS — S0990XA Unspecified injury of head, initial encounter: Secondary | ICD-10-CM | POA: Diagnosis present

## 2023-03-08 MED ORDER — MELOXICAM 15 MG PO TABS: 15.0000 mg | ORAL_TABLET | Freq: Every day | ORAL | 0 refills | Status: DC

## 2023-03-08 MED ORDER — AMPHETAMINE-DEXTROAMPHETAMINE 20 MG PO TABS: 20.0000 mg | ORAL_TABLET | Freq: Every day | ORAL | 0 refills | Status: DC

## 2023-03-08 NOTE — Telephone Encounter (Signed)
Last OV: 02/21/23  Next OV: 05/23/23  Last Filled: 02/03/23  Quantity: 45 and 30

## 2023-03-18 LAB — HM MAMMOGRAPHY

## 2023-04-07 ENCOUNTER — Telehealth: Payer: Self-pay

## 2023-04-07 NOTE — Telephone Encounter (Signed)
 Called pt to discuss care gap in Mammogram screening; advised cb number or sending MyChart message if needing assistance in getting this scheduled and completed.

## 2023-04-09 ENCOUNTER — Other Ambulatory Visit: Payer: Self-pay | Admitting: Physician Assistant

## 2023-04-09 ENCOUNTER — Encounter: Payer: Self-pay | Admitting: Physician Assistant

## 2023-04-11 ENCOUNTER — Encounter: Payer: Self-pay | Admitting: Physician Assistant

## 2023-04-11 MED ORDER — MELOXICAM 15 MG PO TABS: 15.0000 mg | ORAL_TABLET | Freq: Every day | ORAL | 0 refills | Status: DC

## 2023-04-11 MED ORDER — AMPHETAMINE-DEXTROAMPHETAMINE 20 MG PO TABS: 20.0000 mg | ORAL_TABLET | Freq: Every day | ORAL | 0 refills | Status: DC

## 2023-04-11 NOTE — Telephone Encounter (Signed)
 Last OV: 02/21/23  Next OV: 05/23/23  Last FIlled: 03/08/23  Quantity: 45

## 2023-04-12 ENCOUNTER — Encounter: Payer: Self-pay | Admitting: Physician Assistant

## 2023-05-09 ENCOUNTER — Other Ambulatory Visit: Payer: Self-pay | Admitting: Physician Assistant

## 2023-05-10 MED ORDER — MELOXICAM 15 MG PO TABS: 15.0000 mg | ORAL_TABLET | Freq: Every day | ORAL | 0 refills | Status: DC

## 2023-05-10 MED ORDER — AMPHETAMINE-DEXTROAMPHETAMINE 20 MG PO TABS: 20.0000 mg | ORAL_TABLET | Freq: Every day | ORAL | 0 refills | Status: DC

## 2023-05-10 NOTE — Telephone Encounter (Signed)
 Last OV: 02/21/23  Next OV: 05/23/23  Last Filled: 04/11/23  Quantity: 45

## 2023-05-23 ENCOUNTER — Encounter: Payer: Self-pay | Admitting: Physician Assistant

## 2023-05-23 ENCOUNTER — Ambulatory Visit: Payer: Commercial Managed Care - HMO | Admitting: Physician Assistant

## 2023-05-23 ENCOUNTER — Ambulatory Visit (INDEPENDENT_AMBULATORY_CARE_PROVIDER_SITE_OTHER): Admitting: Physician Assistant

## 2023-05-23 VITALS — BP 134/76 | HR 76 | Temp 98.1°F | Ht 59.0 in | Wt 119.0 lb

## 2023-05-23 DIAGNOSIS — F988 Other specified behavioral and emotional disorders with onset usually occurring in childhood and adolescence: Secondary | ICD-10-CM | POA: Diagnosis not present

## 2023-05-23 DIAGNOSIS — M19041 Primary osteoarthritis, right hand: Secondary | ICD-10-CM

## 2023-05-23 DIAGNOSIS — G2581 Restless legs syndrome: Secondary | ICD-10-CM

## 2023-05-23 DIAGNOSIS — Z13220 Encounter for screening for lipoid disorders: Secondary | ICD-10-CM

## 2023-05-23 DIAGNOSIS — K5909 Other constipation: Secondary | ICD-10-CM

## 2023-05-23 LAB — COMPREHENSIVE METABOLIC PANEL WITH GFR
ALT: 14 U/L (ref 0–35)
AST: 17 U/L (ref 0–37)
Albumin: 4.5 g/dL (ref 3.5–5.2)
Alkaline Phosphatase: 68 U/L (ref 39–117)
BUN: 16 mg/dL (ref 6–23)
CO2: 28 meq/L (ref 19–32)
Calcium: 9.6 mg/dL (ref 8.4–10.5)
Chloride: 103 meq/L (ref 96–112)
Creatinine, Ser: 0.72 mg/dL (ref 0.40–1.20)
GFR: 94.89 mL/min (ref >60.00)
Glucose, Bld: 89 mg/dL (ref 70–99)
Potassium: 4 meq/L (ref 3.5–5.1)
Sodium: 138 meq/L (ref 135–145)
Total Bilirubin: 1 mg/dL (ref 0.2–1.2)
Total Protein: 6.6 g/dL (ref 6.0–8.3)

## 2023-05-23 LAB — IBC + FERRITIN
Ferritin: 16.9 ng/mL (ref 10.0–291.0)
Iron: 143 ug/dL (ref 42–145)
Saturation Ratios: 39.4 % (ref 20.0–50.0)
TIBC: 362.6 ug/dL (ref 250.0–450.0)
Transferrin: 259 mg/dL (ref 212.0–360.0)

## 2023-05-23 LAB — VITAMIN B12: Vitamin B-12: 246 pg/mL (ref 211–911)

## 2023-05-23 LAB — CBC WITH DIFFERENTIAL/PLATELET
Basophils Absolute: 0 10*3/uL (ref 0.0–0.1)
Basophils Relative: 0.2 % (ref 0.0–3.0)
Eosinophils Absolute: 0 10*3/uL (ref 0.0–0.7)
Eosinophils Relative: 1.1 % (ref 0.0–5.0)
HCT: 41.1 % (ref 36.0–46.0)
Hemoglobin: 14 g/dL (ref 12.0–15.0)
Lymphocytes Relative: 28.7 % (ref 12.0–46.0)
Lymphs Abs: 0.8 10*3/uL (ref 0.7–4.0)
MCHC: 34 g/dL (ref 30.0–36.0)
MCV: 95.9 fl (ref 78.0–100.0)
Monocytes Absolute: 0.3 10*3/uL (ref 0.1–1.0)
Monocytes Relative: 11.9 % (ref 3.0–12.0)
Neutro Abs: 1.6 10*3/uL (ref 1.4–7.7)
Neutrophils Relative %: 58.1 % (ref 43.0–77.0)
Platelets: 235 10*3/uL (ref 150.0–400.0)
RBC: 4.29 Mil/uL (ref 3.87–5.11)
RDW: 13.1 % (ref 11.5–15.5)
WBC: 2.8 10*3/uL — ABNORMAL LOW (ref 4.0–10.5)

## 2023-05-23 LAB — LIPID PANEL
Cholesterol: 192 mg/dL (ref 0–200)
HDL: 106.7 mg/dL (ref >39.00)
LDL Cholesterol: 76 mg/dL (ref 0–99)
NonHDL: 85.52
Total CHOL/HDL Ratio: 2
Triglycerides: 49 mg/dL (ref 0.0–149.0)
VLDL: 9.8 mg/dL (ref 0.0–40.0)

## 2023-05-23 LAB — MAGNESIUM: Magnesium: 1.9 mg/dL (ref 1.5–2.5)

## 2023-05-23 NOTE — Progress Notes (Signed)
 Patient ID: Raven Gomez, female    DOB: 31-Oct-1970, 53 y.o.   MRN: [id number]   Assessment & Plan:  Attention deficit disorder (ADD) without hyperactivity  Restless leg syndrome -     CBC with Differential/Platelet -     Comprehensive metabolic panel with GFR -     IBC + Ferritin -     Vitamin B12 -     Magnesium  Primary osteoarthritis of right hand -     CBC with Differential/Platelet -     Comprehensive metabolic panel with GFR  Screening for cholesterol level -     Lipid panel  Other constipation      Assessment and Plan Assessment & Plan Hip and Leg Pain   She reports bilateral hip and leg pain, more pronounced at night, disrupting sleep. The pain is radiating rather than cramp-like. Differential diagnosis includes restless leg syndrome, menopause-related symptoms, or other causes of leg pain. She is not currently on medication for this pain.   - Order blood tests including ferritin, CBC, CMP, and magnesium levels to investigate potential causes of leg pain (possible RLS per patient) - Consider checking B12 levels due to borderline past results    Arthritis   She experiences joint pain, particularly in the hands, exacerbated in the morning. There is a suspicion of dietary triggers causing inflammation. She has been taking meloxicam and is adjusting the timing of the dose to improve efficacy. Previous rheumatoid factor tests were negative. She is considering turmeric supplements but is not consistent with her use.   - Continue meloxicam, adjust timing to evening with dinner   - Encourage dietary modifications to identify potential inflammatory triggers   - Consider turmeric supplements for anti-inflammatory benefits    Attention Deficit Hyperactivity Disorder (ADHD)   ADHD is well-managed on Adderall 20 mg twice daily. She reports significant improvement in task management and no side effects. PDMP review confirms recent refill on April 1st, indicating adherence to  medication schedule.   - Continue Adderall 20 mg, one tablet in the morning and one half tab in the afternoon   - Schedule next follow-up in three months    Constipation   She reports infrequent bowel movements, sometimes going four to five days without a bowel movement. Magnesium glycinate is currently being taken but does not aid in regularity. She plans to start Miralax to improve bowel regularity.   - Start Miralax to improve bowel regularity    General Health Maintenance   She engages in physical activity, such as gardening and walking the dog, and focuses on maintaining a balanced diet. Labs will be checked to monitor cholesterol levels and other health markers.   - Encourage regular physical activity and a balanced diet   - Monitor cholesterol levels as part of routine health maintenance      Return in about 3 months (around 08/22/2023) for recheck/follow-up.    Subjective:    Chief Complaint  Patient presents with   ADHD    Pt in office for ADHD follow up and medication refills; pt questions regarding possible labs would like to discuss; Having pain in hip and runs down legs only at night; pt has hip bursitis but has been told this shouldn't be causing issues with legs;    HPI Discussed the use of AI scribe software for clinical note transcription with the patient, who gave verbal consent to proceed.  History of Present Illness Raven Gomez is a 53 year old female  who presents for a regular follow-up visit.  She is here for her regular three-month follow-up for ADHD. 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 She notices a significant difference in her ability to focus without the medication. She has no side effects from the medication and does not have high blood pressure.  She experiences pain in her hips and legs, which she  attributes to bursitis in her hip. The pain is not limited to one side and occurs at night, waking her up. She describes the pain as a radiating sensation, not a nerve pain, and feels the need to move or squeeze her legs. She has not been taking any medication for the pain or restlessness and manages it by repositioning pillows between her legs. She is concerned about her ferritin levels due to symptoms of restless legs.  She has a history of arthritis, which affects her hands, especially in the morning. She has been taking meloxicam, initially in the morning, but has switched to taking it with dinner. She has tried dietary changes and supplements like turmeric but struggles with consistency.  She mentions having mild pressure headaches and dryness, which she attributes to allergies. She takes Allegra but admits to not taking it regularly. She experiences mild swelling in her ankles depending on the type of socks she wears, but no significant swelling or discoloration in her legs.  She experiences constipation, going four to five days without bowel movements. She takes magnesium glycinate but does not notice any improvement in bowel regularity.  She works as a Interior and spatial designer and has a dog that she walks regularly, which helps her stay active. She has been working with a Psychologist, educational for her dog, which has helped her establish a routine.     Past Medical History:  Diagnosis Date   ADHD    Arthritis    Menometrorrhagia 09/30/2022   Menorrhagia    Wears glasses     Past Surgical History:  Procedure Laterality Date   CESAREAN SECTION  2003   CHOLECYSTECTOMY N/A 08/28/2017   Procedure: LAPAROSCOPIC CHOLECYSTECTOMY WITH INTRAOPERATIVE CHOLANGIOGRAM;  Surgeon: Juanita Norlander, MD;  Location: [facility];  Service: General;  Laterality: N/A;   DILITATION & CURRETTAGE/HYSTROSCOPY WITH NOVASURE ABLATION N/A 09/30/2022   Procedure: DILATATION & CURETTAGE/HYSTEROSCOPY WITH NOVASURE ABLATION;  Surgeon: Terri Fester, MD;  Location: [facility];  Service: Gynecology;  Laterality: N/A;   ORIF TOE FRACTURE Right 12/11/2018   Procedure: RIGHT 4TH TOE PINNING;  Surgeon: Donnamarie Gables, MD;  Location: [facility];  Service: Orthopedics;  Laterality: Right;  CPT CODE 40981 SURGERY REQUEST TIME 30 MINUTES   WISDOM TOOTH EXTRACTION  2008    Family History  Problem Relation Age of Onset   Breast cancer Mother 34       12/2014   Arthritis Mother    Cancer Mother    Depression Mother    Hypertension Mother    Stroke Mother    Cancer Father    COPD Father    Heart attack Father    Hypertension Father    Depression Brother    Mental illness Brother    Cancer Maternal Grandmother    Depression Maternal Grandmother    Diabetes Maternal Grandmother    Mental illness Maternal Grandmother    Stroke Maternal Grandfather    Heart  disease Paternal Grandfather    Hypertension Paternal Grandfather    Colon cancer Neg Hx    Esophageal cancer Neg Hx    Stomach cancer Neg Hx    Rectal cancer Neg Hx     Social History   Tobacco Use   Smoking status: Never   Smokeless tobacco: Never  Vaping Use   Vaping status: Never Used  Substance Use Topics   Alcohol use: Yes    Comment: 3-5 glasses of wine per week   Drug use: No     No Known Allergies  Review of Systems NEGATIVE UNLESS OTHERWISE INDICATED IN HPI      Objective:     BP 134/76 (BP Location: Left Arm, Patient Position: Sitting, Cuff Size: Normal)   Pulse 76   Temp 98.1 F (36.7 C) (Temporal)   Ht 4\' 11"  (1.499 m)   Wt 119 lb (54 kg)   SpO2 100%   BMI 24.04 kg/m   Wt Readings from Last 3 Encounters:  05/23/23 119 lb (54 kg)  02/21/23 114 lb (51.7 kg)  11/16/22 120 lb (54.4 kg)    BP Readings from Last 3 Encounters:  05/23/23 134/76  02/21/23 130/80  11/16/22 128/84     Physical Exam Vitals and nursing note reviewed.  Constitutional:      General: She is not in acute distress.     Appearance: Normal appearance. She is not ill-appearing.  HENT:     Right Ear: External ear normal.     Left Ear: External ear normal.     Nose: No congestion.     Mouth/Throat:     Mouth: Mucous membranes are moist.     Pharynx: No oropharyngeal exudate or posterior oropharyngeal erythema.  Eyes:     Extraocular Movements: Extraocular movements intact.     Conjunctiva/sclera: Conjunctivae normal.     Pupils: Pupils are equal, round, and reactive to light.  Cardiovascular:     Rate and Rhythm: Normal rate and regular rhythm.     Pulses: Normal pulses.     Heart sounds: Normal heart sounds. No murmur heard. Pulmonary:     Effort: Pulmonary effort is normal. No respiratory distress.     Breath sounds: Normal breath sounds. No wheezing.  Musculoskeletal:     Cervical back: Normal range of motion.     Right lower leg: No edema.     Left lower leg: No edema.  Skin:    General: Skin is warm.     Findings: No lesion or rash.  Neurological:     Mental Status: She is alert and oriented to person, place, and time.  Psychiatric:        Mood and Affect: Mood normal.        Behavior: Behavior normal.        Teairra Millar M Aneli Zara, PA-C

## 2023-05-24 ENCOUNTER — Other Ambulatory Visit: Payer: Self-pay

## 2023-05-24 ENCOUNTER — Encounter: Payer: Self-pay | Admitting: Physician Assistant

## 2023-05-24 DIAGNOSIS — D729 Disorder of white blood cells, unspecified: Secondary | ICD-10-CM

## 2023-05-30 ENCOUNTER — Ambulatory Visit: Admitting: Physician Assistant

## 2023-06-09 ENCOUNTER — Other Ambulatory Visit: Payer: Self-pay | Admitting: Physician Assistant

## 2023-06-09 MED ORDER — AMPHETAMINE-DEXTROAMPHETAMINE 20 MG PO TABS: 20.0000 mg | ORAL_TABLET | Freq: Every day | ORAL | 0 refills | Status: DC

## 2023-06-09 NOTE — Telephone Encounter (Signed)
 05/23/2023 LOV  05/10/2023 fill date  30/0 refills

## 2023-07-10 ENCOUNTER — Encounter: Payer: Self-pay | Admitting: Physician Assistant

## 2023-07-11 ENCOUNTER — Other Ambulatory Visit: Payer: Self-pay

## 2023-07-11 MED ORDER — AMPHETAMINE-DEXTROAMPHETAMINE 20 MG PO TABS: 20.0000 mg | ORAL_TABLET | Freq: Every day | ORAL | 0 refills | Status: DC

## 2023-07-11 NOTE — Telephone Encounter (Signed)
 Last OV: 05/23/2023  Next OV: 08/22/2023  Quantity: 45  Last refill: 06/09/2023

## 2023-08-15 ENCOUNTER — Encounter: Payer: Self-pay | Admitting: Physician Assistant

## 2023-08-15 NOTE — Telephone Encounter (Signed)
 Pt requesting refill for Adderall 20 mg tablet. Last OV 05/23/2023. Pt next OV is 7/14.

## 2023-08-16 ENCOUNTER — Other Ambulatory Visit: Payer: Self-pay | Admitting: Physician Assistant

## 2023-08-16 MED ORDER — AMPHETAMINE-DEXTROAMPHETAMINE 20 MG PO TABS: 20.0000 mg | ORAL_TABLET | Freq: Every day | ORAL | 0 refills | Status: DC

## 2023-08-22 ENCOUNTER — Encounter: Payer: Self-pay | Admitting: Physician Assistant

## 2023-08-22 ENCOUNTER — Ambulatory Visit: Admitting: Physician Assistant

## 2023-08-22 VITALS — BP 118/72 | HR 68 | Temp 97.9°F | Ht 59.0 in | Wt 121.6 lb

## 2023-08-22 DIAGNOSIS — D72819 Decreased white blood cell count, unspecified: Secondary | ICD-10-CM

## 2023-08-22 DIAGNOSIS — N926 Irregular menstruation, unspecified: Secondary | ICD-10-CM | POA: Diagnosis not present

## 2023-08-22 DIAGNOSIS — F988 Other specified behavioral and emotional disorders with onset usually occurring in childhood and adolescence: Secondary | ICD-10-CM | POA: Diagnosis not present

## 2023-08-22 DIAGNOSIS — Z23 Encounter for immunization: Secondary | ICD-10-CM | POA: Diagnosis not present

## 2023-08-22 LAB — CBC WITH DIFFERENTIAL/PLATELET
Basophils Absolute: 0 K/uL (ref 0.0–0.1)
Basophils Relative: 0.4 % (ref 0.0–3.0)
Eosinophils Absolute: 0 K/uL (ref 0.0–0.7)
Eosinophils Relative: 0.7 % (ref 0.0–5.0)
HCT: 41 % (ref 36.0–46.0)
Hemoglobin: 13.9 g/dL (ref 12.0–15.0)
Lymphocytes Relative: 24.8 % (ref 12.0–46.0)
Lymphs Abs: 1 K/uL (ref 0.7–4.0)
MCHC: 34 g/dL (ref 30.0–36.0)
MCV: 96.3 fl (ref 78.0–100.0)
Monocytes Absolute: 0.5 K/uL (ref 0.1–1.0)
Monocytes Relative: 12.2 % — ABNORMAL HIGH (ref 3.0–12.0)
Neutro Abs: 2.4 K/uL (ref 1.4–7.7)
Neutrophils Relative %: 61.9 % (ref 43.0–77.0)
Platelets: 237 K/uL (ref 150.0–400.0)
RBC: 4.26 Mil/uL (ref 3.87–5.11)
RDW: 13.7 % (ref 11.5–15.5)
WBC: 3.9 K/uL — ABNORMAL LOW (ref 4.0–10.5)

## 2023-08-22 LAB — IBC + FERRITIN
Ferritin: 56.1 ng/mL (ref 10.0–291.0)
Iron: 123 ug/dL (ref 42–145)
Saturation Ratios: 36.3 % (ref 20.0–50.0)
TIBC: 338.8 ug/dL (ref 250.0–450.0)
Transferrin: 242 mg/dL (ref 212.0–360.0)

## 2023-08-22 NOTE — Progress Notes (Signed)
 Patient ID: Raven Gomez, female    DOB: 21-Jan-1971, 53 y.o.   MRN: [id number]   Assessment & Plan:  Leukopenia, unspecified type -     CBC with Differential/Platelet -     IBC + Ferritin  Irregular menses -     CBC with Differential/Platelet -     IBC + Ferritin  Attention deficit disorder (ADD) without hyperactivity  Immunization due -     Tdap vaccine greater than or equal to 7yo IM    Assessment & Plan Post-ablation bleeding Underwent ablation in August for menorrhagia with clot passage. Recently experienced minor bleeding with mild cramping, atypical as she did not cramp pre-ablation. Endometrial regrowth is possible, though less common at her age. - Contact gynecologist for evaluation - Order CBC and iron panel to assess for potential anemia  Leukopenia Previous CBC revealed leukopenia with a white blood cell count of 2.8, below her typical range in the upper threes. - Recheck CBC  ADHD Well-managed on Adderall 20 mg, taken before breakfast, with occasional half tablet in the afternoon if working. PDMP review shows no discrepancies. - Continue Adderall 20 mg - Follow up in three months  General Health Maintenance Engages in physical activities such as hiking and wakeboarding. Focuses on stress management by avoiding emotionally and mentally draining situations. - Encourage continued physical activity and stress management      Return in about 3 months (around 11/22/2023) for recheck/follow-up.    Subjective:    Chief Complaint  Patient presents with   ADHD    Pt in office for 3 mon f/u and medication refills; pt also states some labs were needing to be redrawn to recheck and pt is agreeable to completing today while in office.    HPI Discussed the use of AI scribe software for clinical note transcription with the patient, who gave verbal consent to proceed.  History of Present Illness Raven Gomez is a 53 year old female who presents with  irregular bleeding and cramping.  She has been experiencing irregular bleeding and cramping following a uterine ablation performed in August of the previous year. She had not had a period until last month when she experienced a small amount of bleeding, and again yesterday, she noticed the start of another episode. The bleeding has been light, requiring only a panty liner, but it has been accompanied by cramping, described as 'low cramp' and 'gas pain' that comes and goes. Prior to the ablation, she experienced heavy, painful clotting, which was the reason for the procedure.  Her white blood cell count was noted to be low at 2.8 during a previous check, which is lower than her usual range in the upper threes. She has also experienced some cramping and light bleeding recently, which is unusual for her as she typically did not cramp before the ablation unless passing clots.  She is currently taking Adderall 20 mg, usually before breakfast, and occasionally a half tablet in the afternoon if she is working. The medication is from a different manufacturer, but she has not noticed any difference in its effectiveness.  Socially, she has been more active, engaging in activities such as hiking and wakeboarding, which she has not done in years. She recently purchased a jet ski and [place] which she finds enjoyable and a good way to stay active.     Past Medical History:  Diagnosis Date   ADHD    Arthritis  Menometrorrhagia 09/30/2022   Menorrhagia    Wears glasses     Past Surgical History:  Procedure Laterality Date   CESAREAN SECTION  2003   CHOLECYSTECTOMY N/A 08/28/2017   Procedure: LAPAROSCOPIC CHOLECYSTECTOMY WITH INTRAOPERATIVE CHOLANGIOGRAM;  Surgeon: Ethyl Lenis, MD;  Location: [facility];  Service: General;  Laterality: N/A;   DILITATION & CURRETTAGE/HYSTROSCOPY WITH NOVASURE ABLATION N/A 09/30/2022   Procedure: DILATATION & CURETTAGE/HYSTEROSCOPY WITH NOVASURE  ABLATION;  Surgeon: Barbette Knock, MD;  Location: [facility];  Service: Gynecology;  Laterality: N/A;   ORIF TOE FRACTURE Right 12/11/2018   Procedure: RIGHT 4TH TOE PINNING;  Surgeon: Elsa Lonni SAUNDERS, MD;  Location: [facility];  Service: Orthopedics;  Laterality: Right;  CPT CODE 71333 SURGERY REQUEST TIME 30 MINUTES   WISDOM TOOTH EXTRACTION  2008    Family History  Problem Relation Age of Onset   Breast cancer Mother 29       12/2014   Arthritis Mother    Cancer Mother    Depression Mother    Hypertension Mother    Stroke Mother    Cancer Father    COPD Father    Heart attack Father    Hypertension Father    Depression Brother    Mental illness Brother    Cancer Maternal Grandmother    Depression Maternal Grandmother    Diabetes Maternal Grandmother    Mental illness Maternal Grandmother    Stroke Maternal Grandfather    Heart disease Paternal Grandfather    Hypertension Paternal Grandfather    Colon cancer Neg Hx    Esophageal cancer Neg Hx    Stomach cancer Neg Hx    Rectal cancer Neg Hx     Social History   Tobacco Use   Smoking status: Never   Smokeless tobacco: Never  Vaping Use   Vaping status: Never Used  Substance Use Topics   Alcohol use: Yes    Comment: 3-5 glasses of wine per week   Drug use: No     No Known Allergies  Review of Systems NEGATIVE UNLESS OTHERWISE INDICATED IN HPI      Objective:     BP 118/72 (BP Location: Left Arm, Patient Position: Sitting)   Pulse 68   Temp 97.9 F (36.6 C) (Temporal)   Ht 4' 11 (1.499 m)   Wt 121 lb 9.6 oz (55.2 kg)   SpO2 99%   BMI 24.56 kg/m   Wt Readings from Last 3 Encounters:  08/22/23 121 lb 9.6 oz (55.2 kg)  05/23/23 119 lb (54 kg)  02/21/23 114 lb (51.7 kg)    BP Readings from Last 3 Encounters:  08/22/23 118/72  05/23/23 134/76  02/21/23 130/80     Physical Exam Vitals and nursing note reviewed.  Constitutional:      Appearance: Normal  appearance.  Eyes:     Extraocular Movements: Extraocular movements intact.     Conjunctiva/sclera: Conjunctivae normal.     Pupils: Pupils are equal, round, and reactive to light.  Cardiovascular:     Rate and Rhythm: Normal rate and regular rhythm.     Pulses: Normal pulses.  Pulmonary:     Effort: Pulmonary effort is normal.     Breath sounds: Normal breath sounds.  Neurological:     General: No focal deficit present.     Mental Status: She is alert.  Psychiatric:        Mood and Affect: Mood normal.  Dornell Grasmick M Barney Gertsch, PA-C

## 2023-08-23 ENCOUNTER — Ambulatory Visit: Payer: Self-pay | Admitting: Physician Assistant

## 2023-09-14 ENCOUNTER — Other Ambulatory Visit: Payer: Self-pay

## 2023-09-14 ENCOUNTER — Encounter: Payer: Self-pay | Admitting: Physician Assistant

## 2023-09-14 MED ORDER — AMPHETAMINE-DEXTROAMPHETAMINE 20 MG PO TABS: 20.0000 mg | ORAL_TABLET | Freq: Every day | ORAL | 0 refills | Status: DC

## 2023-09-14 NOTE — Telephone Encounter (Signed)
 Last OV: 08/22/23  Next OV: 11/22/23  Last Filled: 08/16/23  Quantity: 45

## 2023-10-03 ENCOUNTER — Other Ambulatory Visit: Payer: Self-pay

## 2023-10-03 MED ORDER — MELOXICAM 15 MG PO TABS: 15.0000 mg | ORAL_TABLET | Freq: Every day | ORAL | 1 refills | Status: AC

## 2023-10-16 ENCOUNTER — Encounter: Payer: Self-pay | Admitting: Physician Assistant

## 2023-10-17 ENCOUNTER — Other Ambulatory Visit: Payer: Self-pay | Admitting: Physician Assistant

## 2023-10-17 MED ORDER — AMPHETAMINE-DEXTROAMPHETAMINE 20 MG PO TABS: 20.0000 mg | ORAL_TABLET | Freq: Every day | ORAL | 0 refills | Status: DC

## 2023-10-17 NOTE — Telephone Encounter (Signed)
 Please see pt msg and request for refill

## 2023-11-15 ENCOUNTER — Encounter: Payer: Self-pay | Admitting: Physician Assistant

## 2023-11-16 ENCOUNTER — Other Ambulatory Visit: Payer: Self-pay | Admitting: Physician Assistant

## 2023-11-16 MED ORDER — AMPHETAMINE-DEXTROAMPHETAMINE 20 MG PO TABS: 20.0000 mg | ORAL_TABLET | Freq: Every day | ORAL | 0 refills | Status: DC

## 2023-11-16 NOTE — Telephone Encounter (Signed)
 Please see pt request. No refill request has been submitted via pharmacy

## 2023-11-22 ENCOUNTER — Other Ambulatory Visit: Payer: Self-pay | Admitting: Physician Assistant

## 2023-11-22 ENCOUNTER — Ambulatory Visit: Payer: Self-pay | Admitting: Physician Assistant

## 2023-11-22 ENCOUNTER — Ambulatory Visit: Admitting: Physician Assistant

## 2023-11-22 ENCOUNTER — Encounter: Payer: Self-pay | Admitting: Physician Assistant

## 2023-11-22 VITALS — BP 126/82 | HR 88 | Temp 98.1°F | Ht 59.0 in | Wt 120.8 lb

## 2023-11-22 DIAGNOSIS — D72819 Decreased white blood cell count, unspecified: Secondary | ICD-10-CM

## 2023-11-22 DIAGNOSIS — M19041 Primary osteoarthritis, right hand: Secondary | ICD-10-CM

## 2023-11-22 DIAGNOSIS — M19042 Primary osteoarthritis, left hand: Secondary | ICD-10-CM | POA: Diagnosis not present

## 2023-11-22 DIAGNOSIS — F988 Other specified behavioral and emotional disorders with onset usually occurring in childhood and adolescence: Secondary | ICD-10-CM | POA: Diagnosis not present

## 2023-11-22 LAB — CBC WITH DIFFERENTIAL/PLATELET
Basophils Absolute: 0 K/uL (ref 0.0–0.1)
Basophils Relative: 0.2 % (ref 0.0–3.0)
Eosinophils Absolute: 0 K/uL (ref 0.0–0.7)
Eosinophils Relative: 1.2 % (ref 0.0–5.0)
HCT: 40.4 % (ref 36.0–46.0)
Hemoglobin: 13.9 g/dL (ref 12.0–15.0)
Lymphocytes Relative: 31.3 % (ref 12.0–46.0)
Lymphs Abs: 1.1 K/uL (ref 0.7–4.0)
MCHC: 34.4 g/dL (ref 30.0–36.0)
MCV: 95 fl (ref 78.0–100.0)
Monocytes Absolute: 0.4 K/uL (ref 0.1–1.0)
Monocytes Relative: 11.6 % (ref 3.0–12.0)
Neutro Abs: 1.9 K/uL (ref 1.4–7.7)
Neutrophils Relative %: 55.7 % (ref 43.0–77.0)
Platelets: 205 K/uL (ref 150.0–400.0)
RBC: 4.25 Mil/uL (ref 3.87–5.11)
RDW: 12.4 % (ref 11.5–15.5)
WBC: 3.5 K/uL — ABNORMAL LOW (ref 4.0–10.5)

## 2023-11-22 NOTE — Progress Notes (Signed)
 Patient ID: Raven Gomez, female    DOB: [birth year], 53 y.o.   MRN: [id number]   Assessment & Plan:  Attention deficit disorder (ADD) without hyperactivity  Leukopenia, unspecified type -     CBC with Differential/Platelet  Primary osteoarthritis of both hands      Assessment and Plan Assessment & Plan Primary osteoarthritis of hands Chronic osteoarthritis affecting both hands, with the right hand more affected. Symptoms include pain, swelling, and reduced strength, particularly in the thumbs. Meloxicam  is taken daily to manage symptoms, which helps reduce swelling and maintain function. Occasional joint injections have been tried but do not provide lasting relief. - Continue meloxicam  daily - Consider Epsom salt soaks and ice for symptom relief - Refer to specialist if symptoms worsen for potential joint injections  Leukopenia Fluctuating white blood cell count, with a low of 2.8 in April and a recovery to 3.9 in July. Current concern about potential drops in white count and its correlation with other health issues such as arthritis. Previous autoimmune workup was negative. - Order CBC to check current white blood cell count - Consider referral to hematology if white count remains low  Attention-deficit hyperactivity disorder (ADHD) ADHD is well-managed with Adderall 20 mg taken with breakfast. Occasionally requires an additional half tablet in the afternoon, but this is infrequent. No discrepancies noted in the PDMP review. - Continue Adderall 20 mg with breakfast - Monitor for any changes in symptoms or medication needs      Return in about 3 months (around 02/22/2024) for recheck/follow-up / ADHD med check .    Subjective:    Chief Complaint  Patient presents with   Medication Refill    Pt in office for 3 mon f/u for refill on ADHD medications;     Medication Refill   Discussed the use of AI scribe software for clinical note transcription with the  patient, who gave verbal consent to proceed.  History of Present Illness Raven Gomez is a 53 year old female who presents for a three-month follow-up on her ADHD.  She is well-managed on Adderall 20 mg taken with breakfast, occasionally taking a half tablet in the afternoon. She works as a Interior and spatial designer and reports that she has been doing well on her current medication regimen. A PDMP review showed no discrepancies.  She had labs done in July, which were normal, and her weight has remained stable with no concerns about weight loss. She inquires about the timing for her next blood work, noting a previous low white blood cell count of 2.8 in April, which improved to 3.9 in July. She is curious about the fluctuations and whether they relate to her arthritis or bursitis. She has had autoimmune testing, which was normal.  She takes meloxicam  daily for arthritis, as skipping doses results in significant stiffness, particularly in her hands. She experiences more severe symptoms in her right hand, but the left is worsening. She describes a lack of strength and occasional locking in her hands, which impacts her daily activities. She has tried joint injections in the past, but they were not effective long-term. She uses Epsom salt soaks and ice for symptom relief.     Past Medical History:  Diagnosis Date   ADHD    Arthritis    Menometrorrhagia 09/30/2022   Menorrhagia    Wears glasses     Past Surgical History:  Procedure Laterality Date   CESAREAN SECTION  2003   CHOLECYSTECTOMY N/A 08/28/2017   Procedure:  LAPAROSCOPIC CHOLECYSTECTOMY WITH INTRAOPERATIVE CHOLANGIOGRAM;  Surgeon: Ethyl Lenis, MD;  Location: [facility];  Service: General;  Laterality: N/A;   DILITATION & CURRETTAGE/HYSTROSCOPY WITH NOVASURE ABLATION N/A 09/30/2022   Procedure: DILATATION & CURETTAGE/HYSTEROSCOPY WITH NOVASURE ABLATION;  Surgeon: Barbette Knock, MD;  Location: [facility];  Service: Gynecology;   Laterality: N/A;   ORIF TOE FRACTURE Right 12/11/2018   Procedure: RIGHT 4TH TOE PINNING;  Surgeon: Elsa Lonni SAUNDERS, MD;  Location: [facility];  Service: Orthopedics;  Laterality: Right;  CPT CODE 71333 SURGERY REQUEST TIME 30 MINUTES   WISDOM TOOTH EXTRACTION  2008    Family History  Problem Relation Age of Onset   Breast cancer Mother 53       12/2014   Arthritis Mother    Cancer Mother    Depression Mother    Hypertension Mother    Stroke Mother    Cancer Father    COPD Father    Heart attack Father    Hypertension Father    Depression Brother    Mental illness Brother    Cancer Maternal Grandmother    Depression Maternal Grandmother    Diabetes Maternal Grandmother    Mental illness Maternal Grandmother    Stroke Maternal Grandfather    Heart disease Paternal Grandfather    Hypertension Paternal Grandfather    Colon cancer Neg Hx    Esophageal cancer Neg Hx    Stomach cancer Neg Hx    Rectal cancer Neg Hx     Social History   Tobacco Use   Smoking status: Never   Smokeless tobacco: Never  Vaping Use   Vaping status: Never Used  Substance Use Topics   Alcohol use: Yes    Comment: 3-5 glasses of wine per week   Drug use: No     No Known Allergies  Review of Systems NEGATIVE UNLESS OTHERWISE INDICATED IN HPI      Objective:     BP 126/82 (BP Location: Left Arm, Patient Position: Sitting, Cuff Size: Normal)   Pulse 88   Temp 98.1 F (36.7 C) (Temporal)   Ht 4' 11 (1.499 m)   Wt 120 lb 12.8 oz (54.8 kg)   SpO2 98%   BMI 24.40 kg/m   Wt Readings from Last 3 Encounters:  11/22/23 120 lb 12.8 oz (54.8 kg)  08/22/23 121 lb 9.6 oz (55.2 kg)  05/23/23 119 lb (54 kg)    BP Readings from Last 3 Encounters:  11/22/23 126/82  08/22/23 118/72  05/23/23 134/76     Physical Exam Vitals and nursing note reviewed.  Constitutional:      Appearance: Normal appearance.  Eyes:     Extraocular Movements: Extraocular movements  intact.     Conjunctiva/sclera: Conjunctivae normal.     Pupils: Pupils are equal, round, and reactive to light.  Cardiovascular:     Rate and Rhythm: Normal rate and regular rhythm.     Pulses: Normal pulses.  Pulmonary:     Effort: Pulmonary effort is normal.     Breath sounds: Normal breath sounds.  Neurological:     General: No focal deficit present.     Mental Status: She is alert.  Psychiatric:        Mood and Affect: Mood normal.             Mickaela Starlin M Levonte Molina, PA-C

## 2023-12-07 ENCOUNTER — Encounter: Payer: Self-pay | Admitting: Physician Assistant

## 2023-12-18 ENCOUNTER — Encounter: Payer: Self-pay | Admitting: Physician Assistant

## 2023-12-19 ENCOUNTER — Other Ambulatory Visit: Payer: Self-pay

## 2023-12-19 MED ORDER — AMPHETAMINE-DEXTROAMPHETAMINE 20 MG PO TABS: 20.0000 mg | ORAL_TABLET | Freq: Every day | ORAL | 0 refills | Status: DC

## 2023-12-19 NOTE — Telephone Encounter (Signed)
 See pt msg, refill encounter sent for approval

## 2023-12-19 NOTE — Telephone Encounter (Signed)
 Last OV: 11/22/23  Next OV: 02/23/24  Last filled: 11/16/23  Quantity: 45

## 2023-12-20 ENCOUNTER — Inpatient Hospital Stay: Attending: Nurse Practitioner | Admitting: Nurse Practitioner

## 2023-12-20 ENCOUNTER — Encounter: Payer: Self-pay | Admitting: Nurse Practitioner

## 2023-12-20 ENCOUNTER — Inpatient Hospital Stay

## 2023-12-20 VITALS — BP 120/70 | HR 98 | Temp 97.8°F | Resp 17 | Wt 124.5 lb

## 2023-12-20 DIAGNOSIS — E538 Deficiency of other specified B group vitamins: Secondary | ICD-10-CM | POA: Insufficient documentation

## 2023-12-20 DIAGNOSIS — D72819 Decreased white blood cell count, unspecified: Secondary | ICD-10-CM | POA: Insufficient documentation

## 2023-12-20 DIAGNOSIS — Z79899 Other long term (current) drug therapy: Secondary | ICD-10-CM | POA: Insufficient documentation

## 2023-12-20 MED ORDER — CYANOCOBALAMIN 1000 MCG/ML IJ SOLN
1000.0000 ug | Freq: Once | INTRAMUSCULAR | Status: AC
  Administered 2023-12-20: 1000 ug via INTRAMUSCULAR
  Filled 2023-12-20: qty 1

## 2023-12-20 NOTE — Progress Notes (Addendum)
 [facility]   [phone] [phone]   Clinic New consult Note   Patient Care Team: Allwardt, Mardy HERO, PA-C as PCP - General (Physician Assistant) Elouise No, DO as Consulting Physician (Psychiatry) Barbette Knock, MD as Consulting Physician (Obstetrics and Gynecology) 12/20/2023  CHIEF COMPLAINTS/PURPOSE OF CONSULTATION:  Leukopenia, referred by PCP Isaiah Dyke, PA  HISTORY OF PRESENTING ILLNESS:  Raven Gomez 53 y.o. female with PMH including ADHD, squamous cell skin cancer, and osteo arthritis is here because of low white blood cell count.  She recalls Ob telling her WBC was low in 2011. First documented in [facility] 02/11/2015 WBC 3.8 while she had shingles. CBC normalized 02/04/2016 with further workup showing B12 306, folate 14.1, HIV antibody nonreactive, and normal sed rate.  She had a tick bite and lyme studies which were ultimately negative.  WBC range 3.0-3.8 over the next few years.  Hep C nonreactive 10/01/2019.  CBC normal 03/31/2020 with a WBC of 4.0 but was down to 3.4 x 06/30/20.  Autoimmune workup including CRP, sed rate, RF, and ANA were all negative.  WBC normalized to 6.8 10/13/2021 through 09/14/2022 until it dropped to 2 point 09/22/2023.  Iron panel was normal, B12 low range normal at 246. She began oral B12 a few months ago but not consistent and not sure the dose.  Most recent WBC 3.5 on 11/22/2023 and she was referred to hematology.  Socially, she is not married, works as a scientist, research (medical).  Active at home and independent with ADLs.  Up-to-date on age-appropriate cancer screenings.  Drinks alcohol daily 2+ or more or every other day, denies tobacco or drug use.  No known family history of leukopenia or blood condition.  Mother had breast cancer and father had lung cancer.  Today she presents by herself, reports feeling always tired, may nap after work.  Other days she does not require a nap and is very active around her house and in the  yard.  Occasionally her muscles feel sore.  She has a normal appetite, regular BMs with magnesium at night.  Denies any bleeding.  Denies fever, chills, cough, chest pain, dyspnea, night sweats, adenopathy.    MEDICAL HISTORY:  Past Medical History:  Diagnosis Date   ADHD    Arthritis    Menometrorrhagia 09/30/2022   Menorrhagia    Wears glasses     SURGICAL HISTORY: Past Surgical History:  Procedure Laterality Date   CESAREAN SECTION  2003   CHOLECYSTECTOMY N/A 08/28/2017   Procedure: LAPAROSCOPIC CHOLECYSTECTOMY WITH INTRAOPERATIVE CHOLANGIOGRAM;  Surgeon: Ethyl Lenis, MD;  Location: [facility];  Service: General;  Laterality: N/A;   DILITATION & CURRETTAGE/HYSTROSCOPY WITH NOVASURE ABLATION N/A 09/30/2022   Procedure: DILATATION & CURETTAGE/HYSTEROSCOPY WITH NOVASURE ABLATION;  Surgeon: Barbette Knock, MD;  Location: [facility];  Service: Gynecology;  Laterality: N/A;   ORIF TOE FRACTURE Right 12/11/2018   Procedure: RIGHT 4TH TOE PINNING;  Surgeon: Elsa Lonni SAUNDERS, MD;  Location: [facility];  Service: Orthopedics;  Laterality: Right;  CPT CODE 71333 SURGERY REQUEST TIME 30 MINUTES   WISDOM TOOTH EXTRACTION  2008    SOCIAL HISTORY: Social History   Socioeconomic History   Marital status: Divorced    Spouse name: Not on file   Number of children: Not on file   Years of education: Not on file   Highest education level: Not on file  Occupational History   Not on file  Tobacco Use   Smoking status: Never  Smokeless tobacco: Never  Vaping Use   Vaping status: Never Used  Substance and Sexual Activity   Alcohol use: Yes    Comment: 2+ daily or every other day   Drug use: No   Sexual activity: Not on file  Other Topics Concern   Not on file  Social History Narrative   Not on file   Social Drivers of Health   Financial Resource Strain: Not on file  Food Insecurity: Not on file  Transportation Needs: Not on file  Physical  Activity: Not on file  Stress: Not on file  Social Connections: Not on file  Intimate Partner Violence: Not on file    FAMILY HISTORY: Family History  Problem Relation Age of Onset   Breast cancer Mother 10       12/2014   Arthritis Mother    Cancer Mother    Depression Mother    Hypertension Mother    Stroke Mother    Cancer Father    COPD Father    Heart attack Father    Hypertension Father    Depression Brother    Mental illness Brother    Cancer Maternal Grandmother    Depression Maternal Grandmother    Diabetes Maternal Grandmother    Mental illness Maternal Grandmother    Stroke Maternal Grandfather    Heart disease Paternal Grandfather    Hypertension Paternal Grandfather    Colon cancer Neg Hx    Esophageal cancer Neg Hx    Stomach cancer Neg Hx    Rectal cancer Neg Hx     ALLERGIES:  has no known allergies.  MEDICATIONS:  Current Outpatient Medications  Medication Sig Dispense Refill   amphetamine -dextroamphetamine  (ADDERALL) 20 MG tablet Take 1 tablet (20 mg total) by mouth daily before breakfast. Take 1/2 tab in pm prn. 45 tablet 0   Cholecalciferol (VITAMIN D ) 50 MCG (2000 UT) CAPS Take 1,000 Units by mouth 2 (two) times daily.     ibuprofen  (ADVIL ) 200 MG tablet Take 3 tablets (600 mg total) by mouth every 6 (six) hours as needed. 30 tablet 0   magnesium gluconate (MAGONATE) 500 MG tablet Take 500 mg by mouth 2 (two) times daily.     meloxicam  (MOBIC ) 15 MG tablet Take 1 tablet (15 mg total) by mouth daily. 90 tablet 1   Turmeric (QC TUMERIC COMPLEX) 500 MG CAPS Take 1,000 mg by mouth daily.     valACYclovir  (VALTREX ) 1000 MG tablet Take 2 pills twice a day for 1 day at first sign of cold sore 15 tablet 1   No current facility-administered medications for this visit.    REVIEW OF SYSTEMS:   Constitutional: Denies fevers, chills or abnormal night sweats (+) fatigue Eyes: Denies blurriness of vision, double vision or watery eyes Ears, nose, mouth,  throat, and face: Denies mucositis or sore throat Respiratory: Denies cough, dyspnea or wheezes Cardiovascular: Denies palpitation, chest discomfort (+) lower extremity swelling with socks Gastrointestinal:  Denies nausea, vomiting, constipation, diarrhea, hematochezia, heartburn or change in bowel habits Skin: Denies abnormal skin rashes (+) history of squamous cell skin cancer (+) intermittent cold sores Lymphatics: Denies new lymphadenopathy (+) easy bruising Neurological:Denies numbness, tingling or new weaknesses Behavioral/Psych: Mood is stable, no new changes (+) ADHD MSK: (+) OA (+) arthralgia/myalgia All other systems were reviewed with the patient and are negative.  PHYSICAL EXAMINATION:  Vitals:   12/20/23 1248  BP: 120/70  Pulse: 98  Resp: 17  Temp: 97.8 F (36.6 C)  SpO2:  99%   Filed Weights   12/20/23 1248  Weight: 124 lb 8 oz (56.5 kg)    GENERAL:alert, no distress and comfortable SKIN:  no rashes or significant lesions EYES: sclera clear NECK:  without nodularity LYMPH:  no palpable cervical or supraclavicular ymphadenopathy LUNGS: clear with normal breathing effort HEART: regular rate & rhythm, no lower extremity edema ABDOMEN:abdomen soft, non-tender and normal bowel sounds.  No hepatosplenomegaly Musculoskeletal:no cyanosis of digits and no clubbing  PSYCH: alert & oriented x 3 with fluent speech NEURO: no focal motor/sensory deficits  LABORATORY DATA:  I have reviewed the data as listed    Latest Ref Rng & Units 11/22/2023   11:06 AM 08/22/2023    1:33 PM 05/23/2023   10:00 AM  CBC  WBC 4.0 - 10.5 K/uL 3.5  3.9  2.8   Hemoglobin 12.0 - 15.0 g/dL 86.0  86.0  85.9   Hematocrit 36.0 - 46.0 % 40.4  41.0  41.1   Platelets 150.0 - 400.0 K/uL 205.0  237.0  235.0        Latest Ref Rng & Units 05/23/2023   10:00 AM 09/14/2022    2:08 PM 10/13/2021    1:08 PM  CMP  Glucose 70 - 99 mg/dL 89  95  96   BUN 6 - 23 mg/dL 16  12  12    Creatinine 0.40 - 1.20  mg/dL 9.27  9.35  9.24   Sodium 135 - 145 mEq/L 138  135  137   Potassium 3.5 - 5.1 mEq/L 4.0  4.6  3.7   Chloride 96 - 112 mEq/L 103  103  104   CO2 19 - 32 mEq/L 28  23  25    Calcium 8.4 - 10.5 mg/dL 9.6  9.2  9.1   Total Protein 6.0 - 8.3 g/dL 6.6   7.0   Total Bilirubin 0.2 - 1.2 mg/dL 1.0   0.9   Alkaline Phos 39 - 117 U/L 68   60   AST 0 - 37 U/L 17   15   ALT 0 - 35 U/L 14   12      RADIOGRAPHIC STUDIES: I have personally reviewed the radiological images as listed and agreed with the findings in the report. No results found.  ASSESSMENT & PLAN: 53 year old female  Leukopenia -We reviewed her medical record in detail with the patient and discussed common etiologies -She had leukopenia in 2017 during shingles infection, viral infections are known to cause leukopenia -Previous workup including folate, iron, hep C/HIV, and autoimmune studies were all negative/normal -Normal liver/spleen on US  in 2019 -She does have low range B12, likely from alcohol use, which is a known cause of leukopenia.  We recommend more consistent supplementation. She would like to try injection today and see how she feels, then start 1000 mcg daily in 3-4 weeks.  -Recommend to update abdominal US  to r/o liver disease/splenomegaly -Reviewed infection precautions  -Given the mild and intermittent nature, and otherwise normal CBC, this is less likely a primary bone marrow condidtion -Pt can f/up with PCP  Fatigue, arthralgia/myalgia -Likely multifactorial, secondary to low B12, arthritis, vs other -If fatigue does not improve with adequate B12, consider alternative etiologies   Health maintenance -Encouraged to live healthy/active life style and remain UTD on age appropriate cancer screenings     PLAN: -Medical record and historical labs reviewed -Mild/intermittent leukopenia likely secondary to low B12 and component of viral infection -B12 injection today, then begin oral B12 1000 mcg  daily in  01/2024 -Abdominal US  to r/o liver disease/splenomegaly  -F/up with PCP, we can see her back if needed in the future  -Pt seen with Dr. Lanny  Orders Placed This Encounter  Procedures   US  Abdomen Complete    Standing Status:   Future    Expected Date:   12/27/2023    Expiration Date:   12/19/2024    Reason for Exam (SYMPTOM  OR DIAGNOSIS REQUIRED):   leukopenia    Preferred imaging location?:   [facility]     All questions were answered. The patient knows to call the clinic with any problems, questions or concerns.      Raychel Dowler K Doloras [place], NP 12/20/23  Addendum I have seen the patient, examined her. I agree with the assessment and and plan and have edited the notes.   53 year old female with past medical history of ADHD, skin cancer, and osteoarthritis, was referred for chronic intermittent mild leukopenia, her ANC and ALC has been normal.  Previous B12 and folate was normal except once her B12 level was in 200s (low end of normal), HIV, hepatitis test were negative.  She does drink alcohol, which can contribute to leukopenia, I recommend her to stop alcohol, and obtain ultrasound of abdomen to evaluate liver and spleen.  She does complain significant fatigue, and wants to try if B12 injection would help her.  Will give 1 shot today.  I encouraged her to increase oral B12 to 1000 mcg daily again in 3 to 4 weeks.  She will follow-up with her PCP, we will see her as needed if ultrasound is negative.  All questions were answered, I spent a total of 30 minutes for her visit today.  Onita Lanny MD 12/20/2023

## 2023-12-20 NOTE — Patient Instructions (Signed)
 Vitamin B12 Deficiency Vitamin B12 deficiency means that your body does not have enough vitamin B12. The body needs this important vitamin: To make red blood cells. To make genes (DNA). To help the nerves work. If you do not have enough vitamin B12 in your body, you can have health problems, such as not having enough red blood cells in the blood (anemia). What are the causes? Not eating enough foods that contain vitamin B12. Not being able to take in (absorb) vitamin B12 from the food that you eat. Certain diseases. A condition in which the body does not make enough of a certain protein. This results in your body not taking in enough vitamin B12. Having a surgery in which part of the stomach or small intestine is taken out. Taking medicines that make it hard for the body to take in vitamin B12. These include: Heartburn medicines. Some medicines that are used to treat diabetes. What increases the risk? Being an older adult. Eating a vegetarian or vegan diet that does not include any foods that come from animals. Not eating enough foods that contain vitamin B12 while you are pregnant. Taking certain medicines. Having alcoholism. What are the signs or symptoms? In some cases, there are no symptoms. If the condition leads to too few blood cells or nerve damage, symptoms can occur, such as: Feeling weak or tired. Not being hungry. Losing feeling (numbness) or tingling in your hands and feet. Redness and burning of the tongue. Feeling sad (depressed). Confusion or memory problems. Trouble walking. If anemia is very bad, symptoms can include: Being short of breath. Being dizzy. Having a very fast heartbeat. How is this treated? Changing the way you eat and drink, such as: Eating more foods that contain vitamin B12. Drinking little or no alcohol. Getting vitamin B12 shots. Taking vitamin B12 supplements by mouth (orally). Your doctor will tell you the dose that is best for you. Follow  these instructions at home: Eating and drinking  Eat foods that come from animals and have a lot of vitamin B12 in them. These include: Meats and poultry. This includes beef, pork, chicken, [place], and organ meats, such as liver. Seafood, such as clams, rainbow trout, salmon, tuna, and haddock. Eggs. Dairy foods such as milk, yogurt, and cheese. Eat breakfast cereals that have vitamin B12 added to them (are fortified). Check the label. The items listed above may not be a complete list of foods and beverages you can eat and drink. Contact a dietitian for more information. Alcohol use Do not drink alcohol if: Your doctor tells you not to drink. You are pregnant, may be pregnant, or are planning to become pregnant. If you drink alcohol: Limit how much you have to: 0-1 drink a day for women. 0-2 drinks a day for men. Know how much alcohol is in your drink. In the [place] one drink equals one 12 oz bottle of beer (355 mL), one 5 oz glass of wine (148 mL), or one 1 oz glass of hard liquor (44 mL). General instructions Get any vitamin B12 shots if told by your doctor. Take supplements only as told by your doctor. Follow the directions. Keep all follow-up visits. Contact a doctor if: Your symptoms come back. Your symptoms get worse or do not get better with treatment. Get help right away if: You have trouble breathing. You have a very fast heartbeat. You have chest pain. You get dizzy. You faint. These symptoms may be an emergency. Get help right away. Call 911.  Do not wait to see if the symptoms will go away. Do not drive yourself to the hospital. Summary Vitamin B12 deficiency means that your body is not getting enough of the vitamin. In some cases, there are no symptoms of this condition. Treatment may include making a change in the way you eat and drink, getting shots, or taking supplements. Eat foods that have vitamin B12 in them. This information is not intended to replace advice  given to you by your health care provider. Make sure you discuss any questions you have with your health care provider. Document Revised: 09/19/2020 Document Reviewed: 09/19/2020 Elsevier Patient Education  2024 [employer].

## 2023-12-26 ENCOUNTER — Ambulatory Visit (HOSPITAL_COMMUNITY)
Admission: RE | Admit: 2023-12-26 | Discharge: 2023-12-26 | Disposition: A | Discharge status: Home / Self Care | Source: Ambulatory Visit | Attending: Nurse Practitioner | Admitting: Nurse Practitioner

## 2023-12-26 DIAGNOSIS — D72819 Decreased white blood cell count, unspecified: Secondary | ICD-10-CM | POA: Insufficient documentation

## 2023-12-30 ENCOUNTER — Ambulatory Visit: Payer: Self-pay | Admitting: Nurse Practitioner

## 2024-01-04 NOTE — Telephone Encounter (Signed)
 Called patient to give her lab results.  Left message to call back

## 2024-01-09 ENCOUNTER — Encounter: Payer: Self-pay | Admitting: *Deleted

## 2024-01-18 ENCOUNTER — Encounter: Payer: Self-pay | Admitting: Physician Assistant

## 2024-01-18 ENCOUNTER — Other Ambulatory Visit: Payer: Self-pay

## 2024-01-18 MED ORDER — AMPHETAMINE-DEXTROAMPHETAMINE 20 MG PO TABS: 20.0000 mg | ORAL_TABLET | Freq: Every day | ORAL | 0 refills | Status: DC

## 2024-01-18 NOTE — Telephone Encounter (Signed)
 Rx sent by PCP 01/18/24

## 2024-01-18 NOTE — Telephone Encounter (Signed)
 Last OV: 11/22/23  Next OV: 02/23/24  Last filled: 12/19/23  Quantity: 45

## 2024-02-19 ENCOUNTER — Encounter: Payer: Self-pay | Admitting: Physician Assistant

## 2024-02-20 ENCOUNTER — Other Ambulatory Visit: Payer: Self-pay

## 2024-02-20 MED ORDER — AMPHETAMINE-DEXTROAMPHETAMINE 20 MG PO TABS: 20.0000 mg | ORAL_TABLET | Freq: Every day | ORAL | 0 refills | Status: AC

## 2024-02-20 NOTE — Telephone Encounter (Signed)
 Last OV: 11/22/23  Next OV: 02/23/24  Last filled: 01/18/24  Quantity: 30  Pt sent MyChart message requesting refill

## 2024-02-23 ENCOUNTER — Encounter: Payer: Self-pay | Admitting: Physician Assistant

## 2024-02-23 ENCOUNTER — Ambulatory Visit (INDEPENDENT_AMBULATORY_CARE_PROVIDER_SITE_OTHER): Admitting: Physician Assistant

## 2024-02-23 VITALS — BP 136/84 | HR 94 | Temp 97.4°F | Ht 59.0 in | Wt 125.0 lb

## 2024-02-23 DIAGNOSIS — D72819 Decreased white blood cell count, unspecified: Secondary | ICD-10-CM

## 2024-02-23 DIAGNOSIS — F988 Other specified behavioral and emotional disorders with onset usually occurring in childhood and adolescence: Secondary | ICD-10-CM | POA: Diagnosis not present

## 2024-02-23 DIAGNOSIS — E538 Deficiency of other specified B group vitamins: Secondary | ICD-10-CM | POA: Diagnosis not present

## 2024-02-23 MED ORDER — CYANOCOBALAMIN 1000 MCG/ML IJ SOLN
1000.0000 ug | Freq: Once | INTRAMUSCULAR | Status: AC
  Administered 2024-02-23: 1000 ug via INTRAMUSCULAR

## 2024-02-23 NOTE — Patient Instructions (Signed)
" °  VISIT SUMMARY: Raven Gomez is a 54 year old female who came in for a follow-up visit for ADHD and leukopenia due to B12 deficiency. She is doing well on her current ADHD medication and received a B12 injection today for her deficiency.  YOUR PLAN: ATTENTION DEFICIT DISORDER WITHOUT HYPERACTIVITY: ADHD is managed with Adderall 20 mg with breakfast. She reports consistent use and prefers shorter workdays to maintain focus and emotional well-being. -Continue taking Adderall 20 mg with breakfast.  LEUKOPENIA DUE TO B12 DEFICIENCY: Leukopenia attributed to B12 deficiency. Hematology recommended B12 supplementation. Abdominal ultrasound was normal, ruling out liver disease and splenomegaly. She received a B12 injection in November and is due for another injection today. Advised to reduce alcohol intake as it may affect B12 levels. -Administered B12 injection today. -Continue oral B12 supplementation with a daily multivitamin or B complex vitamin. -Reduce alcohol intake.  GENERAL HEALTH MAINTENANCE: Colonoscopy is due this year as the last one was performed in July 2021. -Contact GI to schedule colonoscopy.                      Contains text generated by [employer].                                 Contains text generated by [employer].   "

## 2024-02-23 NOTE — Progress Notes (Signed)
 "   Patient ID: Raven Gomez, female    DOB: 1970-12-10, 54 y.o.   MRN: [id number]   Assessment & Plan:  Attention deficit disorder (ADD) without hyperactivity  Leukopenia, unspecified type  B12 deficiency -     Cyanocobalamin       Assessment & Plan Attention deficit disorder without hyperactivity ADHD is managed with Adderall 20 mg with breakfast. She reports consistent use and prefers shorter workdays to maintain focus and emotional well-being. - Continue Adderall 20 mg with breakfast. - PDMP reviewed today, no red flags, filling appropriately.    Leukopenia due to B12 deficiency Leukopenia attributed to B12 deficiency. Hematology recommended B12 supplementation. Abdominal ultrasound was normal, ruling out liver disease and splenomegaly. She received a B12 injection in November and is due for another injection today. Advised to reduce alcohol intake as it may affect B12 levels. - Administered B12 injection today. - Continue oral B12 supplementation with a daily multivitamin or B complex vitamin. - Advised to reduce alcohol intake.  General Health Maintenance Colonoscopy is due this year as the last one was performed in July 2021. She is advised to contact GI for scheduling. - Contact GI to schedule colonoscopy.      Return in about 3 months (around 05/23/2024) for recheck/follow-up.    Subjective:    Chief Complaint  Patient presents with   ADHD    Pt in office for ADHD follow up and medication refills;     HPI Discussed the use of AI scribe software for clinical note transcription with the patient, who gave verbal consent to proceed.  History of Present Illness Raven Gomez is a 54 year old female with ADHD and leukopenia due to B12 deficiency who presents for a follow-up visit.  She is currently taking Adderall 20 mg with breakfast for ADHD and reports doing well with the medication. She does not consistently take a second dose and has adjusted her  work schedule to shorter days, which helps maintain her focus and emotional well-being.  She has been experiencing leukopenia, which was evaluated by hematology in November. An abdominal ultrasound was performed to rule out liver disease and splenomegaly, and the results were normal. Hematology told her that the leukopenia was most likely due to low B12.  She has a history of B12 deficiency and has been trying to drink more water and reduce alcohol intake. She received a B12 injection in November.  She had a cold recently, which she describes as particularly debilitating, but it resolved without medical intervention.  She discusses her social situation, including challenges with decluttering her home and managing belongings from her late mother. She finds it stressful and overwhelming but is working on it gradually. She enjoys spending time with her family and her dog, who has had some health issues including allergies and gastrointestinal problems.     Past Medical History:  Diagnosis Date   ADHD    Arthritis    Menometrorrhagia 09/30/2022   Menorrhagia    Wears glasses     Past Surgical History:  Procedure Laterality Date   CESAREAN SECTION  2003   CHOLECYSTECTOMY N/A 08/28/2017   Procedure: LAPAROSCOPIC CHOLECYSTECTOMY WITH INTRAOPERATIVE CHOLANGIOGRAM;  Surgeon: Ethyl Lenis, MD;  Location: [facility];  Service: General;  Laterality: N/A;   DILITATION & CURRETTAGE/HYSTROSCOPY WITH NOVASURE ABLATION N/A 09/30/2022   Procedure: DILATATION & CURETTAGE/HYSTEROSCOPY WITH NOVASURE ABLATION;  Surgeon: Barbette Knock, MD;  Location: [facility];  Service: Gynecology;  Laterality: N/A;   ORIF  TOE FRACTURE Right 12/11/2018   Procedure: RIGHT 4TH TOE PINNING;  Surgeon: Elsa Lonni SAUNDERS, MD;  Location: [facility];  Service: Orthopedics;  Laterality: Right;  CPT CODE 71333 SURGERY REQUEST TIME 30 MINUTES   WISDOM TOOTH EXTRACTION  2008    Family History   Problem Relation Age of Onset   Breast cancer Mother 33       12/2014   Arthritis Mother    Cancer Mother    Depression Mother    Hypertension Mother    Stroke Mother    Cancer Father    COPD Father    Heart attack Father    Hypertension Father    Depression Brother    Mental illness Brother    Cancer Maternal Grandmother    Depression Maternal Grandmother    Diabetes Maternal Grandmother    Mental illness Maternal Grandmother    Stroke Maternal Grandfather    Heart disease Paternal Grandfather    Hypertension Paternal Grandfather    Colon cancer Neg Hx    Esophageal cancer Neg Hx    Stomach cancer Neg Hx    Rectal cancer Neg Hx     Social History[1]   Allergies[2]  Review of Systems NEGATIVE UNLESS OTHERWISE INDICATED IN HPI      Objective:     BP 136/84 (BP Location: Left Arm, Patient Position: Sitting, Cuff Size: Normal)   Pulse 94   Temp (!) 97.4 F (36.3 C) (Temporal)   Ht 4' 11 (1.499 m)   Wt 125 lb (56.7 kg)   SpO2 97%   BMI 25.25 kg/m   Wt Readings from Last 3 Encounters:  02/23/24 125 lb (56.7 kg)  12/20/23 124 lb 8 oz (56.5 kg)  11/22/23 120 lb 12.8 oz (54.8 kg)    BP Readings from Last 3 Encounters:  02/23/24 136/84  12/20/23 120/70  11/22/23 126/82     Physical Exam Vitals and nursing note reviewed.  Constitutional:      Appearance: Normal appearance.  Eyes:     Extraocular Movements: Extraocular movements intact.     Conjunctiva/sclera: Conjunctivae normal.     Pupils: Pupils are equal, round, and reactive to light.  Cardiovascular:     Rate and Rhythm: Normal rate and regular rhythm.     Pulses: Normal pulses.  Pulmonary:     Effort: Pulmonary effort is normal.     Breath sounds: Normal breath sounds.  Neurological:     General: No focal deficit present.     Mental Status: She is alert.  Psychiatric:        Mood and Affect: Mood normal.             Jonathin Heinicke M Jaymie Mckiddy, PA-C     [1]  Social History Tobacco  Use   Smoking status: Never   Smokeless tobacco: Never  Vaping Use   Vaping status: Never Used  Substance Use Topics   Alcohol use: Yes    Comment: 2+ daily or every other day   Drug use: No  [2] No Known Allergies  "

## 2024-05-24 ENCOUNTER — Ambulatory Visit: Admitting: Physician Assistant
